# Patient Record
Sex: Female | Born: 1955
Health system: Southern US, Community
[De-identification: ages and names within clinical notes are randomized; demographics above are authoritative.]

## PROBLEM LIST (undated history)

## (undated) DIAGNOSIS — Z5189 Encounter for other specified aftercare: Secondary | ICD-10-CM

## (undated) DIAGNOSIS — Z972 Presence of dental prosthetic device (complete) (partial): Secondary | ICD-10-CM

## (undated) DIAGNOSIS — E785 Hyperlipidemia, unspecified: Secondary | ICD-10-CM

## (undated) DIAGNOSIS — T7840XA Allergy, unspecified, initial encounter: Secondary | ICD-10-CM

## (undated) DIAGNOSIS — F419 Anxiety disorder, unspecified: Secondary | ICD-10-CM

## (undated) DIAGNOSIS — Z973 Presence of spectacles and contact lenses: Secondary | ICD-10-CM

## (undated) DIAGNOSIS — K219 Gastro-esophageal reflux disease without esophagitis: Secondary | ICD-10-CM

## (undated) HISTORY — DX: Anxiety disorder, unspecified: F41.9

## (undated) HISTORY — PX: FRACTURE SURGERY: SHX138

## (undated) HISTORY — DX: Encounter for other specified aftercare: Z51.89

## (undated) HISTORY — PX: MANDIBLE FRACTURE SURGERY: SHX706

## (undated) HISTORY — PX: COSMETIC SURGERY: SHX468

## (undated) HISTORY — DX: Hyperlipidemia, unspecified: E78.5

## (undated) HISTORY — DX: Allergy, unspecified, initial encounter: T78.40XA

---

## 1998-02-18 ENCOUNTER — Other Ambulatory Visit: Admission: RE | Admit: 1998-02-18 | Discharge: 1998-02-18 | Payer: Self-pay | Admitting: Obstetrics & Gynecology

## 1999-05-08 ENCOUNTER — Other Ambulatory Visit: Admission: RE | Admit: 1999-05-08 | Discharge: 1999-05-08 | Payer: Self-pay | Admitting: Obstetrics & Gynecology

## 1999-10-18 ENCOUNTER — Other Ambulatory Visit: Admission: RE | Admit: 1999-10-18 | Discharge: 1999-10-18 | Payer: Self-pay | Admitting: Obstetrics & Gynecology

## 1999-11-06 ENCOUNTER — Other Ambulatory Visit: Admission: RE | Admit: 1999-11-06 | Discharge: 1999-11-06 | Payer: Self-pay | Admitting: Obstetrics & Gynecology

## 2000-01-12 ENCOUNTER — Other Ambulatory Visit: Admission: RE | Admit: 2000-01-12 | Discharge: 2000-01-12 | Payer: Self-pay | Admitting: Obstetrics & Gynecology

## 2000-11-27 ENCOUNTER — Other Ambulatory Visit: Admission: RE | Admit: 2000-11-27 | Discharge: 2000-11-27 | Payer: Self-pay | Admitting: Obstetrics & Gynecology

## 2002-01-07 ENCOUNTER — Other Ambulatory Visit: Admission: RE | Admit: 2002-01-07 | Discharge: 2002-01-07 | Payer: Self-pay | Admitting: Obstetrics & Gynecology

## 2003-02-01 ENCOUNTER — Other Ambulatory Visit: Admission: RE | Admit: 2003-02-01 | Discharge: 2003-02-01 | Payer: Self-pay | Admitting: Obstetrics & Gynecology

## 2004-04-12 ENCOUNTER — Other Ambulatory Visit: Admission: RE | Admit: 2004-04-12 | Discharge: 2004-04-12 | Payer: Self-pay | Admitting: Obstetrics & Gynecology

## 2005-05-21 ENCOUNTER — Other Ambulatory Visit: Admission: RE | Admit: 2005-05-21 | Discharge: 2005-05-21 | Payer: Self-pay | Admitting: Obstetrics & Gynecology

## 2005-05-24 ENCOUNTER — Ambulatory Visit: Payer: Self-pay

## 2006-08-01 ENCOUNTER — Ambulatory Visit: Payer: Self-pay

## 2007-09-25 ENCOUNTER — Ambulatory Visit: Payer: Self-pay | Admitting: Internal Medicine

## 2008-03-25 ENCOUNTER — Ambulatory Visit: Payer: Self-pay | Admitting: Internal Medicine

## 2008-04-01 ENCOUNTER — Ambulatory Visit: Payer: Self-pay

## 2009-04-12 ENCOUNTER — Other Ambulatory Visit: Payer: Self-pay | Admitting: Internal Medicine

## 2009-05-03 ENCOUNTER — Other Ambulatory Visit: Payer: Self-pay | Admitting: Internal Medicine

## 2011-06-07 ENCOUNTER — Ambulatory Visit (INDEPENDENT_AMBULATORY_CARE_PROVIDER_SITE_OTHER): Payer: PRIVATE HEALTH INSURANCE | Admitting: Internal Medicine

## 2011-06-07 ENCOUNTER — Encounter: Payer: Self-pay | Admitting: Internal Medicine

## 2011-06-07 VITALS — BP 108/70 | HR 111 | Temp 97.8°F | Ht 64.0 in | Wt 133.0 lb

## 2011-06-07 DIAGNOSIS — R079 Chest pain, unspecified: Secondary | ICD-10-CM | POA: Insufficient documentation

## 2011-06-07 DIAGNOSIS — K219 Gastro-esophageal reflux disease without esophagitis: Secondary | ICD-10-CM | POA: Insufficient documentation

## 2011-06-07 DIAGNOSIS — J329 Chronic sinusitis, unspecified: Secondary | ICD-10-CM

## 2011-06-07 DIAGNOSIS — R0789 Other chest pain: Secondary | ICD-10-CM

## 2011-06-07 MED ORDER — OMEPRAZOLE 40 MG PO CPDR: 40.0000 mg | DELAYED_RELEASE_CAPSULE | Freq: Every day | ORAL | Status: DC

## 2011-06-07 MED ORDER — AMOXICILLIN-POT CLAVULANATE 875-125 MG PO TABS: 1.0000 | ORAL_TABLET | Freq: Two times a day (BID) | ORAL | Status: AC

## 2011-06-07 NOTE — Assessment & Plan Note (Signed)
Symptoms most consistent with anxiety and GERD.  EKG today was normal.  Will start omeprazole and have her avoid using sudafed. Follow up 1 month or sooner if symptoms are persistent.

## 2011-06-07 NOTE — Progress Notes (Signed)
Subjective:    Patient ID: [name] [name], female    DOB: May 21, 1955, 56 y.o.   MRN: [id number]  HPI 56 year old female with history of anxiety presents for acute visit as a walk-in patient. She reports that about 2 weeks ago she had an upper respiratory infection with nasal congestion and nonproductive cough. Her symptoms seemed to improve until one week ago when she developed increasing sinus pressure, nasal congestion with purulent drainage, nonproductive cough, and some tightness in her chest. She took Sudafed to help with symptoms and had some palpitations after this. She denies any fever or chills. She denies shortness of breath. During this time, she also notes some increased tightness in her chest after eating. She denies any nausea or vomiting. She has not previously taken medications for GERD.  Outpatient Encounter Prescriptions as of 06/07/2011  Medication Sig Dispense Refill  . amoxicillin-clavulanate (AUGMENTIN) 875-125 MG per tablet Take 1 tablet by mouth 2 (two) times daily.  20 tablet  0  . cholecalciferol (VITAMIN D) 1000 UNITS tablet Take 1,000 Units by mouth daily.      . cyanocobalamin 100 MCG tablet Take 100 mcg by mouth daily.      . fexofenadine (ALLEGRA) 180 MG tablet Take 180 mg by mouth daily.      . niacin 250 MG tablet Take 250 mg by mouth daily with breakfast.      . norethindrone-ethinyl estradiol 1/35 (ORTHO-NOVUM, NORTREL,CYCLAFEM) tablet Take 1 tablet by mouth daily.      Marland Kitchen omeprazole (PRILOSEC) 40 MG capsule Take 1 capsule (40 mg total) by mouth daily.  30 capsule  3    Review of Systems  Constitutional: Negative for fever, chills and unexpected weight change.  HENT: Positive for congestion, rhinorrhea, postnasal drip and sinus pressure. Negative for hearing loss, ear pain, nosebleeds, sore throat, facial swelling, sneezing, mouth sores, trouble swallowing, neck pain, neck stiffness, voice change, tinnitus and ear discharge.   Eyes: Negative for pain,  discharge, redness and visual disturbance.  Respiratory: Positive for cough and chest tightness. Negative for shortness of breath, wheezing and stridor.   Cardiovascular: Positive for chest pain and palpitations. Negative for leg swelling.  Musculoskeletal: Negative for myalgias and arthralgias.  Skin: Negative for color change and rash.  Neurological: Negative for dizziness, weakness, light-headedness and headaches.  Hematological: Negative for adenopathy.   BP 108/70  Pulse 111  Temp(Src) 97.8 F (36.6 C) (Oral)  Ht 5\' 4"  (1.626 m)  Wt 133 lb (60.328 kg)  BMI 22.83 kg/m2  SpO2 100%     Objective:   Physical Exam  Constitutional: She is oriented to person, place, and time. She appears well-developed and well-nourished. No distress.  HENT:  Head: Normocephalic and atraumatic.  Right Ear: External ear normal. Tympanic membrane is bulging. Tympanic membrane is not erythematous. A middle ear effusion is present.  Left Ear: External ear normal. Tympanic membrane is erythematous and bulging. A middle ear effusion is present.  Nose: Mucosal edema present.  Mouth/Throat: Oropharynx is clear and moist. No oropharyngeal exudate.  Eyes: Conjunctivae are normal. Pupils are equal, round, and reactive to light. Right eye exhibits no discharge. Left eye exhibits no discharge. No scleral icterus.  Neck: Normal range of motion. Neck supple. No tracheal deviation present. No thyromegaly present.  Cardiovascular: Normal rate, regular rhythm, normal heart sounds and intact distal pulses.  Exam reveals no gallop and no friction rub.   No murmur heard. Pulmonary/Chest: Effort normal and breath sounds normal. No respiratory distress. She  has no wheezes. She has no rales. She exhibits no tenderness.  Musculoskeletal: Normal range of motion. She exhibits no edema and no tenderness.  Lymphadenopathy:    She has no cervical adenopathy.  Neurological: She is alert and oriented to person, place, and time. No  cranial nerve deficit. She exhibits normal muscle tone. Coordination normal.  Skin: Skin is warm and dry. No rash noted. She is not diaphoretic. No erythema. No pallor.  Psychiatric: She has a normal mood and affect. Her behavior is normal. Judgment and thought content normal.          Assessment & Plan:

## 2011-06-07 NOTE — Assessment & Plan Note (Signed)
Chest tightness after eating most consistent with GERD.  Will start Omeprazole to see if any improvement. Follow up in 1 month.

## 2011-06-07 NOTE — Assessment & Plan Note (Signed)
Symptoms consistent with sinusitis with bilateral middle ear effusions.  Will start augmentin x 10 days.  Encouraged pt to avoid sudafed because of palpitations. Continue Allegra and use Tylenol or Ibuprofen prn.  Follow up in 1 month for physical exam and prn if symptoms not improving.

## 2011-06-25 LAB — HM PAP SMEAR: HM Pap smear: NORMAL

## 2011-07-17 ENCOUNTER — Ambulatory Visit (INDEPENDENT_AMBULATORY_CARE_PROVIDER_SITE_OTHER): Payer: PRIVATE HEALTH INSURANCE | Admitting: Internal Medicine

## 2011-07-17 ENCOUNTER — Other Ambulatory Visit (HOSPITAL_COMMUNITY)
Admission: RE | Admit: 2011-07-17 | Discharge: 2011-07-17 | Disposition: A | Discharge status: Home / Self Care | Payer: PRIVATE HEALTH INSURANCE | Source: Ambulatory Visit | Attending: Internal Medicine | Admitting: Internal Medicine

## 2011-07-17 ENCOUNTER — Encounter: Payer: Self-pay | Admitting: Internal Medicine

## 2011-07-17 VITALS — BP 112/68 | HR 82 | Temp 98.3°F | Resp 16 | Ht 64.0 in | Wt 135.0 lb

## 2011-07-17 DIAGNOSIS — Z124 Encounter for screening for malignant neoplasm of cervix: Secondary | ICD-10-CM

## 2011-07-17 DIAGNOSIS — Z1211 Encounter for screening for malignant neoplasm of colon: Secondary | ICD-10-CM

## 2011-07-17 DIAGNOSIS — Z1159 Encounter for screening for other viral diseases: Secondary | ICD-10-CM | POA: Insufficient documentation

## 2011-07-17 DIAGNOSIS — Z0001 Encounter for general adult medical examination with abnormal findings: Secondary | ICD-10-CM | POA: Insufficient documentation

## 2011-07-17 DIAGNOSIS — Z0000 Encounter for general adult medical examination without abnormal findings: Secondary | ICD-10-CM

## 2011-07-17 DIAGNOSIS — Z1239 Encounter for other screening for malignant neoplasm of breast: Secondary | ICD-10-CM

## 2011-07-17 DIAGNOSIS — E785 Hyperlipidemia, unspecified: Secondary | ICD-10-CM

## 2011-07-17 DIAGNOSIS — Z01419 Encounter for gynecological examination (general) (routine) without abnormal findings: Secondary | ICD-10-CM | POA: Insufficient documentation

## 2011-07-17 NOTE — Assessment & Plan Note (Addendum)
Breast and pelvic exams were done today and grossly normal.  PAP sent. Screening mammogram ordered

## 2011-07-17 NOTE — Progress Notes (Signed)
Patient ID: BELLADONNA LUBINSKI, female   DOB: 1955-04-10, 56 y.o.   MRN: [id number]  Subjective:     MARYBELLA ETHIER is a 56 y.o. female here for a routine exam.  Current complaints: none.  Personal health questionnaire reviewed: yes.   Gynecologic History Patient's last menstrual period was 06/25/2011. Contraception: OCP (estrogen/progesterone) Last Pap: 2011. Results were: normal Last mammogram: April 2012 Results were: normal  Obstetric History OB History    Grav Para Term Preterm Abortions TAB SAB Ect Mult Living                   The following portions of the patient's history were reviewed and updated as appropriate: allergies, current medications, past family history, past medical history, past social history, past surgical history and problem list.  Review of Systems Pertinent items are noted in HPI.    Objective:    BP 112/68  Pulse 82  Temp(Src) 98.3 F (36.8 C) (Oral)  Resp 16  Ht 5\' 4"  (1.626 m)  Wt 135 lb (61.236 kg)  BMI 23.17 kg/m2  SpO2 99%  LMP 06/25/2011  General Appearance:    Alert, cooperative, no distress, appears stated age  Head:    Normocephalic, without obvious abnormality, atraumatic  Eyes:    PERRL, conjunctiva/corneas clear, EOM's intact, fundi    benign, both eyes  Ears:    Normal TM's and external ear canals, both ears  Nose:   Nares normal, septum midline, mucosa normal, no drainage    or sinus tenderness  Throat:   Lips, mucosa, and tongue normal; teeth and gums normal  Neck:   Supple, symmetrical, trachea midline, no adenopathy;    thyroid:  no enlargement/tenderness/nodules; no carotid   bruit or JVD  Back:     Symmetric, no curvature, ROM normal, no CVA tenderness  Lungs:     Clear to auscultation bilaterally, respirations unlabored  Chest Wall:    No tenderness or deformity   Heart:    Regular rate and rhythm, S1 and S2 normal, no murmur, rub   or gallop  Breast Exam:    No tenderness, masses, or nipple abnormality  Abdomen:      Soft, non-tender, bowel sounds active all four quadrants,    no masses, no organomegaly  Genitalia:    Normal female without lesion, discharge or tenderness  Rectal:    Normal tone, normal prostate, no masses or tenderness;   guaiac negative stool  Extremities:   Extremities normal, atraumatic, no cyanosis or edema  Pulses:   2+ and symmetric all extremities  Skin:   Skin color, texture, turgor normal, no rashes or lesions  Lymph nodes:   Cervical, supraclavicular, and axillary nodes normal  Neurologic:   CNII-XII intact, normal strength, sensation and reflexes    throughout      Assessment:    Healthy female exam.    Plan:    Education reviewed: calcium supplements, self breast exams and weight bearing exercise. Contraception: OCP (estrogen/progesterone).

## 2011-07-17 NOTE — Assessment & Plan Note (Addendum)
Reviewed her recent lipid panel.  Total 206  LDL 120 HDL 68.  FH of CAD in both parents who were smokers.  rec exercise and Low GI diet.  Stop niacin for now since it is aggravatng hot flashes and palpitations

## 2011-07-17 NOTE — Patient Instructions (Addendum)
Consider swlitching from allegra to zyrtec (cetirizine)  And using Simply saline flushes for your sinuses twice daily   You can use Afrin once daily at bedtime without becoming hooked on it.  ------------------------------------------------------------------------------------------------------------ I am referring you for colonoscopy to Dr. Bluford Kaufmann  Consider the Low Glycemic Index Diet and 6 smaller meals daily .  This boosts your metabolism and regulates your sugars:   7 AM Low carbohydrate Protein  Shakes (EAS Carb Control  Or Atkins ,  Available everywhere,   In  cases at [employer] )  2.5 carbs  (Add or substitute a toasted sandwhich thin w/ peanut butter)  10 AM: Protein bar by Atkins (snack size,  Chocolate lover's variety at  [employer])    Lunch: sandwich on pita bread or flatbread (Joseph's makes a pita bread and a flat bread , available at [employer] and [employer]; Toufayah makes a low carb flatbread available at [employer] and [employer]) Mission makes a low carb whole wheat tortilla available at [employer] most grocery stores   3 PM:  Mid day :  Another protein bar,  Or a  cheese stick, 1/4 cup of almonds, walnuts, pistachios, pecans, peanuts,  Macadamia nuts  6 PM  Dinner:  "mean and green:"  Meat/chicken/fish, salad, and green veggie : use ranch, vinagrette,  Blue cheese, etc  9 PM snack : Breyer's low carb fudgsicle or  ice cream bar (Carb Smart), or  Weight Watcher's ice cream bar , or another protein shake

## 2011-07-18 ENCOUNTER — Other Ambulatory Visit: Payer: Self-pay | Admitting: Internal Medicine

## 2011-07-18 LAB — LIPID PANEL
HDL Cholesterol: 63 mg/dL — ABNORMAL HIGH (ref 40–60)
Ldl Cholesterol, Calc: 81 mg/dL (ref 0–100)
Triglycerides: 98 mg/dL (ref 0–200)
VLDL Cholesterol, Calc: 20 mg/dL (ref 5–40)

## 2011-07-31 ENCOUNTER — Encounter: Payer: Self-pay | Admitting: Internal Medicine

## 2011-09-18 ENCOUNTER — Ambulatory Visit: Payer: Self-pay | Admitting: Internal Medicine

## 2011-09-19 ENCOUNTER — Telehealth: Payer: Self-pay | Admitting: Internal Medicine

## 2011-09-19 NOTE — Telephone Encounter (Signed)
Left message notifying patient.

## 2011-09-19 NOTE — Telephone Encounter (Signed)
Her mammogram was normal.  Repeat in one year 

## 2011-10-08 ENCOUNTER — Encounter: Payer: Self-pay | Admitting: Internal Medicine

## 2011-10-11 ENCOUNTER — Telehealth: Payer: Self-pay | Admitting: Internal Medicine

## 2011-10-11 NOTE — Telephone Encounter (Signed)
Pt called stated she received letter telling her it was time for cpx  Pt stated she had this done 06/30/11  Please advise She also stated she had her mammogram this year @ [facility]   Pt stated she had her colonscopy set up   11/12/11 with DR O Pt also needs a new my chart activation number she wants to set this up

## 2011-10-12 NOTE — Telephone Encounter (Signed)
patient notified to disregard the letter, she did have her physical in April.

## 2011-11-12 ENCOUNTER — Ambulatory Visit: Payer: Self-pay | Admitting: Gastroenterology

## 2011-11-12 LAB — HM COLONOSCOPY: HM Colonoscopy: NORMAL

## 2011-11-14 LAB — PATHOLOGY REPORT

## 2011-11-26 ENCOUNTER — Encounter: Payer: Self-pay | Admitting: Internal Medicine

## 2011-12-07 ENCOUNTER — Encounter: Payer: Self-pay | Admitting: Internal Medicine

## 2011-12-09 ENCOUNTER — Encounter: Payer: Self-pay | Admitting: Internal Medicine

## 2011-12-17 ENCOUNTER — Encounter: Payer: Self-pay | Admitting: Internal Medicine

## 2011-12-21 ENCOUNTER — Other Ambulatory Visit: Payer: Self-pay | Admitting: *Deleted

## 2011-12-21 ENCOUNTER — Telehealth: Payer: Self-pay | Admitting: Internal Medicine

## 2011-12-21 MED ORDER — NORETHIN-ETH ESTRAD TRIPHASIC 0.5/0.75/1-35 MG-MCG PO TABS: 1.0000 | ORAL_TABLET | Freq: Every day | ORAL | Status: DC

## 2011-12-21 MED ORDER — NORETHINDRONE-ETH ESTRADIOL 1-35 MG-MCG PO TABS: 1.0000 | ORAL_TABLET | Freq: Every day | ORAL | Status: DC

## 2011-12-21 NOTE — Telephone Encounter (Signed)
Refill request for necon 7-09-23-26 84 tab Take one tablet by mouth daily

## 2011-12-21 NOTE — Telephone Encounter (Signed)
Pt is going out of town and is needing the rx as soon as possible. She does apologize for the last minute request. She would like to be called when it is ready,  Best number [phone]

## 2011-12-21 NOTE — Telephone Encounter (Signed)
Patient needs her birth control pills phoned into [facility] she needs this called in today. She did call and ask them to send request for it.

## 2011-12-21 NOTE — Telephone Encounter (Signed)
Rx sent, pt informed via VM

## 2011-12-24 ENCOUNTER — Encounter: Payer: Self-pay | Admitting: Internal Medicine

## 2012-03-14 ENCOUNTER — Other Ambulatory Visit: Payer: Self-pay | Admitting: Internal Medicine

## 2012-03-14 MED ORDER — NORETHIN-ETH ESTRAD TRIPHASIC 0.5/0.75/1-35 MG-MCG PO TABS: 1.0000 | ORAL_TABLET | Freq: Every day | ORAL | Status: DC

## 2012-03-14 NOTE — Telephone Encounter (Signed)
Pt is out of her birth control and is needing a refill on Necon 7-09-23-26 tablet She uses [facility].

## 2012-03-14 NOTE — Telephone Encounter (Signed)
Med filled.  

## 2012-04-10 ENCOUNTER — Other Ambulatory Visit: Payer: Self-pay | Admitting: Internal Medicine

## 2012-04-10 NOTE — Telephone Encounter (Signed)
Scheduled

## 2012-04-10 NOTE — Telephone Encounter (Signed)
She will need to make an appt. To discuss the whether she needs to continue hormone therapy bc it is not a simple conversation

## 2012-04-10 NOTE — Telephone Encounter (Signed)
Pt calling with questions about her birth control Rx.  Pt states she was told by her pharmacy that because she is now 56yo her insurance will not cover her birth control anymore and that it needs to be a hormone pill. Pt needed the refill this week. Please advise pt.  Can call [phone] or [phone]

## 2012-04-14 ENCOUNTER — Encounter: Payer: Self-pay | Admitting: Internal Medicine

## 2012-04-14 ENCOUNTER — Ambulatory Visit (INDEPENDENT_AMBULATORY_CARE_PROVIDER_SITE_OTHER): Payer: 59 | Admitting: Internal Medicine

## 2012-04-14 VITALS — BP 110/70 | HR 81 | Temp 97.7°F | Resp 16 | Wt 139.8 lb

## 2012-04-14 DIAGNOSIS — J3089 Other allergic rhinitis: Secondary | ICD-10-CM

## 2012-04-14 DIAGNOSIS — Z5320 Procedure and treatment not carried out because of patient's decision for unspecified reasons: Secondary | ICD-10-CM

## 2012-04-14 MED ORDER — NORETHIN-ETH ESTRAD TRIPHASIC 0.5/0.75/1-35 MG-MCG PO TABS: 1.0000 | ORAL_TABLET | Freq: Every day | ORAL | Status: DC

## 2012-04-14 MED ORDER — FEXOFENADINE-PSEUDOEPHED ER 180-240 MG PO TB24: 1.0000 | ORAL_TABLET | Freq: Every day | ORAL | Status: DC

## 2012-04-14 NOTE — Progress Notes (Signed)
Patient ID: [name] [name], female   DOB: 08-20-1955, 57 y.o.   MRN: [id number]  Patient Active Problem List  Diagnosis  . Sinusitis  . GERD (gastroesophageal reflux disease)  . Chest pain  . Other and unspecified hyperlipidemia  . Annual physical exam  . Allergic rhinitis due to dust  . Menopause present, declines hormone replacement therapy    Subjective:  CC:   Chief Complaint  Patient presents with  . medication management    Birth Control    HPI:   [name] Rosier Smithis a 57 y.o. female who presents to discuss hormone therapy. She has been been taking birth control for contraception  For over 20 years and was told recently by her pharmacist that her insurance would no longer pay because of her age.  She has menstrual periods monthly and has no idea what age her mother went through menopause.   2) chronic dizziness,.  No change in symptoms with zyrtec , currently on allegra.  ENT started flonase recently but she developed nosebleeds with it     Past Medical History  Diagnosis Date  . Hyperlipidemia   . Anxiety   . Allergy     History reviewed. No pertinent past surgical history.       The following portions of the patient's history were reviewed and updated as appropriate: Allergies, current medications, and problem list.    Review of Systems:   Patient denies headache, fevers, malaise, unintentional weight loss, skin rash, eye pain,  sore throat, dysphagia,  hemoptysis , cough, dyspnea, wheezing, chest pain, palpitations, orthopnea, edema, abdominal pain, nausea, melena, diarrhea, constipation, flank pain, dysuria, hematuria, urinary  Frequency, nocturia, numbness, tingling, seizures,  Focal weakness, Loss of consciousness,  Tremor, insomnia, depression, anxiety, and suicidal ideation.         History   Social History  . Marital Status: Married    Spouse Name: N/A    Number of Children: N/A  . Years of Education: N/A   Occupational History  . Not on  file.   Social History Main Topics  . Smoking status: Never Smoker   . Smokeless tobacco: Never Used  . Alcohol Use: No  . Drug Use: No  . Sexually Active: Not on file   Other Topics Concern  . Not on file   Social History Narrative   Lives in [place]. Works at [employer].    Objective:  BP 110/70  Pulse 81  Temp 97.7 F (36.5 C) (Oral)  Resp 16  Wt 139 lb 12 oz (63.39 kg)  SpO2 98%  LMP 04/07/2012  General appearance: alert, cooperative and appears stated age Ears: normal TM's and external ear canals both ears Throat: lips, mucosa, and tongue normal; teeth and gums normal Neck: no adenopathy, no carotid bruit, supple, symmetrical, trachea midline and thyroid not enlarged, symmetric, no tenderness/mass/nodules Back: symmetric, no curvature. ROM normal. No CVA tenderness. Lungs: clear to auscultation bilaterally Heart: regular rate and rhythm, S1, S2 normal, no murmur, click, rub or gallop Abdomen: soft, non-tender; bowel sounds normal; no masses,  no organomegaly Pulses: 2+ and symmetric Skin: Skin color, texture, turgor normal. No rashes or lesions Lymph nodes: Cervical, supraclavicular, and axillary nodes normal.  Assessment and Plan:  Allergic rhinitis due to dust rec trial of allegra d and saline flushes daily  Menopause present, declines hormone replacement therapy Patient has been using oral contraception for many years and continues to have menstruation at age 7 .  The risk and benefits of continuing  hormonal manipulation were discussed at length.  She has no desire to continue hormonal therapy for menopause, only to prevent pregnancy. Suggested she stop her medication for 6 weeks and allow [place] to confirm menopause so she can stop OCPs   Updated Medication List Outpatient Encounter Prescriptions as of 04/14/2012  Medication Sig Dispense Refill  . cholecalciferol (VITAMIN D) 1000 UNITS tablet Take 1,000 Units by mouth daily.      . cyanocobalamin 100 MCG tablet Take  1,000 mcg by mouth daily.       . norethindrone-ethinyl estradiol (ORTHO-NOVUM 7/7/7, 28,) 0.5/0.75/1-35 MG-MCG tablet Take 1 tablet by mouth daily.  2 Package  3  . [DISCONTINUED] fexofenadine (ALLEGRA) 180 MG tablet Take 180 mg by mouth daily.      . [DISCONTINUED] norethindrone-ethinyl estradiol (ORTHO-NOVUM 7/7/7, 28,) 0.5/0.75/1-35 MG-MCG tablet Take 1 tablet by mouth daily.  1 Package  3  . [DISCONTINUED] pseudoephedrine (SUDAFED) 30 MG tablet Take 60 mg by mouth every 4 (four) hours as needed.      . fexofenadine-pseudoephedrine (ALLEGRA-D 24 HOUR) 180-240 MG per 24 hr tablet Take 1 tablet by mouth daily.  30 tablet  11  . norethindrone-ethinyl estradiol (TRIPHASIL,CYCLAFEM,ALYACEN) 0.5/0.75/1-35 MG-MCG tablet Take 1 tablet by mouth daily.  1 Package  11  . [DISCONTINUED] niacin 250 MG tablet Take 500 mg by mouth daily with breakfast.          No orders of the defined types were placed in this encounter.    No Follow-up on file.

## 2012-04-14 NOTE — Patient Instructions (Addendum)
If you want to be tested for menopause, return after a minimum of 6 weeks without contraceptives  Try allegra D daily for the congestion  Try rinsing sinuses twice daily with Dr. Nigel Mormon sinus rinse or Simply Saline

## 2012-04-14 NOTE — Assessment & Plan Note (Addendum)
rec trial of allegra d and saline flushes daily

## 2012-04-15 ENCOUNTER — Encounter: Payer: Self-pay | Admitting: Internal Medicine

## 2012-04-15 DIAGNOSIS — Z5320 Procedure and treatment not carried out because of patient's decision for unspecified reasons: Secondary | ICD-10-CM | POA: Insufficient documentation

## 2012-04-15 NOTE — Assessment & Plan Note (Signed)
Patient has been using oral contraception for many years and continues to have menstruation at age 57 .  Suggested she stop her medication for 6 weeks and allow [place] to confirm menopause so she can stop OCPs

## 2012-12-18 ENCOUNTER — Telehealth: Payer: Self-pay | Admitting: Internal Medicine

## 2012-12-18 DIAGNOSIS — R5381 Other malaise: Secondary | ICD-10-CM

## 2012-12-18 DIAGNOSIS — Z1159 Encounter for screening for other viral diseases: Secondary | ICD-10-CM

## 2012-12-18 DIAGNOSIS — E785 Hyperlipidemia, unspecified: Secondary | ICD-10-CM

## 2012-12-18 DIAGNOSIS — E559 Vitamin D deficiency, unspecified: Secondary | ICD-10-CM

## 2012-12-18 NOTE — Telephone Encounter (Signed)
Pt asking if she can get labs done a few days before physical 10/8 so they can discuss results at appointment.  No orders in.  No lab appt scheduled at this time.  May call work or cell to advise pt.

## 2012-12-18 NOTE — Telephone Encounter (Signed)
Pt notified, lab appt scheduled  

## 2012-12-18 NOTE — Telephone Encounter (Signed)
What labs would you like? and I will call her.

## 2012-12-18 NOTE — Telephone Encounter (Signed)
Yes, I have ordered fasting labs

## 2012-12-19 ENCOUNTER — Other Ambulatory Visit (INDEPENDENT_AMBULATORY_CARE_PROVIDER_SITE_OTHER): Payer: 59

## 2012-12-19 DIAGNOSIS — R5381 Other malaise: Secondary | ICD-10-CM

## 2012-12-19 DIAGNOSIS — Z1159 Encounter for screening for other viral diseases: Secondary | ICD-10-CM

## 2012-12-19 DIAGNOSIS — E559 Vitamin D deficiency, unspecified: Secondary | ICD-10-CM

## 2012-12-19 DIAGNOSIS — E785 Hyperlipidemia, unspecified: Secondary | ICD-10-CM

## 2012-12-19 LAB — CBC WITH DIFFERENTIAL/PLATELET
Basophils Relative: 0.5 % (ref 0.0–3.0)
Eosinophils Relative: 1.2 % (ref 0.0–5.0)
Hemoglobin: 12.5 g/dL (ref 12.0–15.0)
Lymphocytes Relative: 27.2 % (ref 12.0–46.0)
Monocytes Relative: 10 % (ref 3.0–12.0)
Neutro Abs: 3 10*3/uL (ref 1.4–7.7)
RBC: 4.51 Mil/uL (ref 3.87–5.11)
WBC: 5 10*3/uL (ref 4.5–10.5)

## 2012-12-19 LAB — COMPREHENSIVE METABOLIC PANEL
BUN: 17 mg/dL (ref 6–23)
CO2: 28 mEq/L (ref 19–32)
Calcium: 9.3 mg/dL (ref 8.4–10.5)
Chloride: 105 mEq/L (ref 96–112)
Creatinine, Ser: 0.7 mg/dL (ref 0.4–1.2)
GFR: 100.29 mL/min (ref >60.00)
Sodium: 139 mEq/L (ref 135–145)

## 2012-12-19 LAB — LIPID PANEL
Cholesterol: 179 mg/dL (ref 0–200)
Total CHOL/HDL Ratio: 3
Triglycerides: 76 mg/dL (ref 0.0–149.0)

## 2012-12-20 LAB — HEPATITIS C ANTIBODY: HCV Ab: NEGATIVE

## 2012-12-21 ENCOUNTER — Encounter: Payer: Self-pay | Admitting: Internal Medicine

## 2012-12-23 NOTE — Telephone Encounter (Signed)
Mailed unread message to pt  

## 2012-12-24 ENCOUNTER — Encounter: Payer: Self-pay | Admitting: Internal Medicine

## 2012-12-24 ENCOUNTER — Ambulatory Visit (INDEPENDENT_AMBULATORY_CARE_PROVIDER_SITE_OTHER): Payer: 59 | Admitting: Internal Medicine

## 2012-12-24 VITALS — BP 112/82 | HR 99 | Temp 98.0°F | Resp 14 | Ht 64.0 in | Wt 134.5 lb

## 2012-12-24 DIAGNOSIS — N912 Amenorrhea, unspecified: Secondary | ICD-10-CM

## 2012-12-24 DIAGNOSIS — Z5320 Procedure and treatment not carried out because of patient's decision for unspecified reasons: Secondary | ICD-10-CM

## 2012-12-24 DIAGNOSIS — Z1239 Encounter for other screening for malignant neoplasm of breast: Secondary | ICD-10-CM

## 2012-12-24 DIAGNOSIS — J3089 Other allergic rhinitis: Secondary | ICD-10-CM

## 2012-12-24 DIAGNOSIS — E785 Hyperlipidemia, unspecified: Secondary | ICD-10-CM

## 2012-12-24 DIAGNOSIS — N911 Secondary amenorrhea: Secondary | ICD-10-CM

## 2012-12-24 DIAGNOSIS — Z0000 Encounter for general adult medical examination without abnormal findings: Secondary | ICD-10-CM

## 2012-12-24 MED ORDER — MONTELUKAST SODIUM 10 MG PO TABS: 10.0000 mg | ORAL_TABLET | Freq: Every day | ORAL | Status: DC

## 2012-12-24 NOTE — Assessment & Plan Note (Addendum)
She discontinued  birth control as of  August 30

## 2012-12-24 NOTE — Patient Instructions (Addendum)
I recommend starting Singulair for your allergies,  It is NOT an antihistamine so it will not have the side effects of dryness that the Allegra does  Stop the allegra after a few days of overlapping them.  yoy may not need both.  Time will tell  Use the sudafed as needed for sinus pressure  Your cholesterol does not need treatment unless your LDL is > 160  Return in 6 months for hormone levels if you desire (not absolute)    I will order your mammogram   Ask Employee [facility] about your TDaP vaccine (if you had it) and whether they will be offering the Prevnar pneumonia vaccine

## 2012-12-24 NOTE — Progress Notes (Signed)
Patient ID: [name] [name], female   DOB: 1955-07-25, 57 y.o.   MRN: [id number]   .[email or web address] Subjective:     [name] [name] is a 57 y.o. female and is here for a comprehensive physical exam. The patient reports .Marland Kitchen  History   Social History  . Marital Status: Married    Spouse Name: N/A    Number of Children: N/A  . Years of Education: N/A   Occupational History  . Not on file.   Social History Main Topics  . Smoking status: Never Smoker   . Smokeless tobacco: Never Used  . Alcohol Use: No  . Drug Use: No  . Sexual Activity: Not on file   Other Topics Concern  . Not on file   Social History Narrative   Lives in [place]. Works at [employer].   Health Maintenance  Topic Date Due  . Tetanus/tdap  03/03/1976  . Mammogram  09/17/2013  . Influenza Vaccine  10/17/2013  . Pap Smear  07/17/2014  . Colonoscopy  11/11/2021    The following portions of the patient's history were reviewed and updated as appropriate: allergies, current medications, past family history, past medical history, past social history, past surgical history and problem list.  Review of Systems A comprehensive review of systems was negative.   Objective:   BP 112/82  Pulse 99  Temp(Src) 98 F (36.7 C) (Oral)  Resp 14  Ht 5\' 4"  (1.626 m)  Wt 134 lb 8 oz (61.009 kg)  BMI 23.08 kg/m2  SpO2 99%  LMP 11/18/2012  General Appearance:    Alert, cooperative, no distress, appears stated age  Head:    Normocephalic, without obvious abnormality, atraumatic  Eyes:    PERRL, conjunctiva/corneas clear, EOM's intact, fundi    benign, both eyes  Ears:    Normal TM's and external ear canals, both ears  Nose:   Nares normal, septum midline, mucosa normal, no drainage    or sinus tenderness  Throat:   Lips, mucosa, and tongue normal; teeth and gums normal  Neck:   Supple, symmetrical, trachea midline, no adenopathy;    thyroid:  no enlargement/tenderness/nodules; no carotid   bruit or JVD  Back:     Symmetric, no  curvature, ROM normal, no CVA tenderness  Lungs:     Clear to auscultation bilaterally, respirations unlabored  Chest Wall:    No tenderness or deformity   Heart:    Regular rate and rhythm, S1 and S2 normal, no murmur, rub   or gallop  Breast Exam:    No tenderness, masses, or nipple abnormality  Abdomen:     Soft, non-tender, bowel sounds active all four quadrants,    no masses, no organomegaly  Extremities:   Extremities normal, atraumatic, no cyanosis or edema  Pulses:   2+ and symmetric all extremities  Skin:   Skin color, texture, turgor normal, no rashes or lesions  Lymph nodes:   Cervical, supraclavicular, and axillary nodes normal  Neurologic:   CNII-XII intact, normal strength, sensation and reflexes    throughout    Assessment:   Menopause present, declines hormone replacement therapy She discontinued  birth control as of  August 30.  She is concerned about when she will enter menopause.  Advised to return in 6 months for testing if desired.   Allergic rhinitis due to dust continue allegra  Will add singulair if symptoms are not controlled.   Annual physical exam Annual comprehensive exam was done including breast without pelvic exam .  All screenings have been addressed .   Other and unspecified hyperlipidemia Lab Results  Component Value Date   CHOL 179 12/19/2012   HDL 68.60 12/19/2012   LDLCALC 95 12/19/2012   TRIG 76.0 12/19/2012   CHOLHDL 3 12/19/2012   Her 10 year risk of CAD is 2.89% based on Framingham Risk Index.  Reassurance provided that she does not need statin therapy at this time.    Updated Medication List Outpatient Encounter Prescriptions as of 12/24/2012  Medication Sig Dispense Refill  . cholecalciferol (VITAMIN D) 1000 UNITS tablet Take 1,000 Units by mouth daily.      . cyanocobalamin 100 MCG tablet Take 1,000 mcg by mouth daily.       . fexofenadine-pseudoephedrine (ALLEGRA-D 24 HOUR) 180-240 MG per 24 hr tablet Take 1 tablet by mouth daily.  30  tablet  11  . montelukast (SINGULAIR) 10 MG tablet Take 1 tablet (10 mg total) by mouth at bedtime.  30 tablet  3  . norethindrone-ethinyl estradiol (ORTHO-NOVUM 7/7/7, 28,) 0.5/0.75/1-35 MG-MCG tablet Take 1 tablet by mouth daily.  2 Package  3  . norethindrone-ethinyl estradiol (TRIPHASIL,CYCLAFEM,ALYACEN) 0.5/0.75/1-35 MG-MCG tablet Take 1 tablet by mouth daily.  1 Package  11   No facility-administered encounter medications on file as of 12/24/2012.

## 2012-12-25 NOTE — Assessment & Plan Note (Signed)
contineu allelgra  Will add singulair if symptoms are not controlled.

## 2012-12-25 NOTE — Assessment & Plan Note (Addendum)
Lab Results  Component Value Date   CHOL 179 12/19/2012   HDL 68.60 12/19/2012   LDLCALC 95 12/19/2012   TRIG 76.0 12/19/2012   CHOLHDL 3 12/19/2012   Her 10 year risk of CAD is 2.89% based on Framingham Risk Index.  Reassurance provided that she does not need statin therapy at this time.

## 2012-12-25 NOTE — Assessment & Plan Note (Signed)
Annual comprehensive exam was done including breast without pelvic exam . All screenings have been addressed .

## 2013-01-01 ENCOUNTER — Telehealth: Payer: Self-pay | Admitting: Internal Medicine

## 2013-01-01 ENCOUNTER — Ambulatory Visit (INDEPENDENT_AMBULATORY_CARE_PROVIDER_SITE_OTHER): Payer: 59 | Admitting: Family Medicine

## 2013-01-01 ENCOUNTER — Encounter: Payer: Self-pay | Admitting: Family Medicine

## 2013-01-01 VITALS — BP 112/78 | HR 76 | Temp 98.2°F | Wt 133.0 lb

## 2013-01-01 DIAGNOSIS — I10 Essential (primary) hypertension: Secondary | ICD-10-CM

## 2013-01-01 DIAGNOSIS — T50995A Adverse effect of other drugs, medicaments and biological substances, initial encounter: Secondary | ICD-10-CM

## 2013-01-01 DIAGNOSIS — J3089 Other allergic rhinitis: Secondary | ICD-10-CM

## 2013-01-01 DIAGNOSIS — T7840XA Allergy, unspecified, initial encounter: Secondary | ICD-10-CM | POA: Insufficient documentation

## 2013-01-01 NOTE — Progress Notes (Signed)
  Subjective:    Patient ID: [name] [name], female    DOB: Sep 14, 1955, 57 y.o.   MRN: [id number]  HPI CC: ?med reaction  Started on singulair 12/25/2012 at night for significant allergic rhinitis.  Had bad leg cramp on Friday night.  Woke up again on Sunday morning with headache.  This week started itching and had rash develop. No worsening dyspnea, chest pain, coughing,   Has also been taking allegra. Nasal steroids caused epistaxis in the past.  Allergic rhinitis - to dust. sxs present with pressure headaches, congestion, and dry throat.  + PNdrainage.  Mild rhinorrhea and no sneezing.   Past Medical History  Diagnosis Date  . Hyperlipidemia   . Anxiety   . Allergy     Review of Systems Per HPi    Objective:   Physical Exam  Vitals reviewed. Constitutional: She appears well-developed and well-nourished. No distress.  HENT:  Head: Normocephalic and atraumatic.  Right Ear: Hearing, tympanic membrane, external ear and ear canal normal.  Left Ear: Hearing, tympanic membrane, external ear and ear canal normal.  Nose: Septal deviation (to right) present.  Mouth/Throat: Uvula is midline, oropharynx is clear and moist and mucous membranes are normal. No oropharyngeal exudate, posterior oropharyngeal edema, posterior oropharyngeal erythema or tonsillar abscesses.  Pale nasal mucosa  Eyes: Conjunctivae and EOM are normal. Pupils are equal, round, and reactive to light. No scleral icterus.  Neck: Normal range of motion. Neck supple.  Cardiovascular: Normal rate, regular rhythm, normal heart sounds and intact distal pulses.   No murmur heard. Pulmonary/Chest: Effort normal and breath sounds normal. No respiratory distress. She has no wheezes. She has no rales.  Lymphadenopathy:    She has no cervical adenopathy.  Skin: Skin is warm and dry. Rash noted.  2 separate rashes: 1. erythematous faint pruritic papules on upper lateral and anterior arms, excoriated.  Area of bruising noted  right upper arm. 2. Small area of nonblanching petechial rash bilateral axilla       Assessment & Plan:

## 2013-01-01 NOTE — Telephone Encounter (Signed)
FYI

## 2013-01-01 NOTE — Assessment & Plan Note (Signed)
Singulair was likely responsible for recent symptoms including myalgia, headache, and skin rash. ?churg strauss type reaction to LTRA - check CBC with diff for peripheral eosinophilia - mainly for information in case reaction does not continue to improve - if eosinophilia, would consider steroid course. In interim, use benadryl prn and anticipate daily improvement.  Pt agrees with plan.

## 2013-01-01 NOTE — Telephone Encounter (Signed)
Patient Information:  Caller Name: Laury  Phone: [phone]  Patient: [name] [name], [name] [name]  Gender: Female  DOB: 11/23/1955  Age: 57 Years  PCP: Duncan Dull (Adults only)  Pregnant: No  Office Follow Up:  Does the office need to follow up with this patient?: No  Instructions For The Office: N/A  RN Note:  Off BCP since 11/15/12. Noted blood colored spots under both arms 12/31/12. No appointments remain for 01/01/13 at [facility].  Scheduled for 1300 01/01/13 with Dr Sharen Hones at [facility].    Symptoms  Reason For Call & Symptoms: Called to request MD change Singulair to another Rx for sinuses.  Reports symptoms of an allergic reaction;  initially had leg cramps and headache that resolved followed by itching arms with "welts" and bruises from rubbing since 12/29/12.  Stopped Singulair 12/30/12 with improvement of symptoms.  Reviewed Health History In EMR: Yes  Reviewed Medications In EMR: Yes  Reviewed Allergies In EMR: Yes  Reviewed Surgeries / Procedures: Yes  Date of Onset of Symptoms: 12/27/2012  Treatments Tried: Stopped Singulair 12/30/12  Treatments Tried Worked: Yes OB / GYN:  LMP: 11/17/2012  Guideline(s) Used:  Hives  Rash - Widespread on Drugs - Drug Reaction  Disposition Per Guideline:   Go to Office Now  Reason For Disposition Reached:   Purple or blood-colored spots or dots (no fever)  Advice Given:  N/A  Patient Will Follow Care Advice:  YES

## 2013-01-01 NOTE — Patient Instructions (Addendum)
I do think this is singulair reaction - I'm glad you've stopped this. For itch - use benadryl 25mg  as needed (may make you sleepy). Let us know if not improving with this. Rash should slowly continue to improve. Blood test today.

## 2013-01-01 NOTE — Assessment & Plan Note (Signed)
Discussed return to antihistamine + decongestant for allergic rhinitis.

## 2013-01-02 LAB — CBC WITH DIFFERENTIAL/PLATELET
Basophils Absolute: 0.1 10*3/uL (ref 0.0–0.1)
Basophils Relative: 1.9 % (ref 0.0–3.0)
Eosinophils Relative: 0.6 % (ref 0.0–5.0)
Monocytes Absolute: 0.6 10*3/uL (ref 0.1–1.0)
Monocytes Relative: 7.5 % (ref 3.0–12.0)
Neutrophils Relative %: 63.1 % (ref 43.0–77.0)
Platelets: 243 10*3/uL (ref 150.0–400.0)
RBC: 4.17 Mil/uL (ref 3.87–5.11)
WBC: 7.4 10*3/uL (ref 4.5–10.5)

## 2013-01-02 NOTE — Progress Notes (Signed)
Pt rescheduled for later in theday.

## 2013-01-06 ENCOUNTER — Ambulatory Visit: Payer: Self-pay | Admitting: Internal Medicine

## 2013-01-28 ENCOUNTER — Encounter: Payer: Self-pay | Admitting: Internal Medicine

## 2013-05-02 IMAGING — MG MM CAD SCREENING MAMMO
1 series · 4 of 4 positions shown · non-contrast
Comparison: none

REASON FOR EXAM: SCR MAMMO CAT 2 NO ORDER
COMMENTS:

[R CC · right · 4 of 4 slices shown]
[im 1/4]
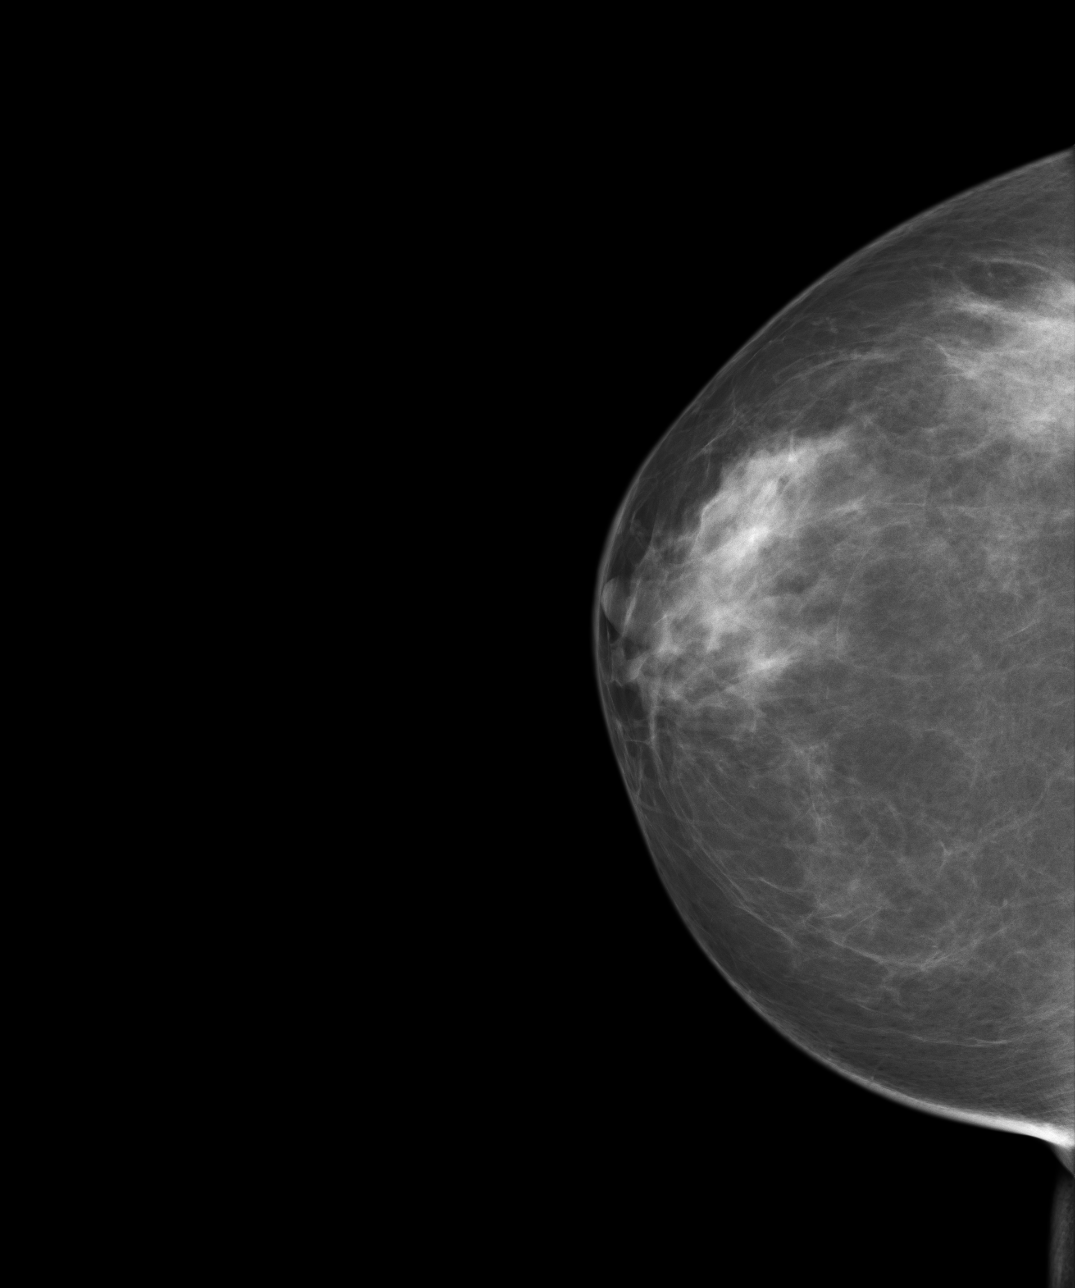
[im 2/4]
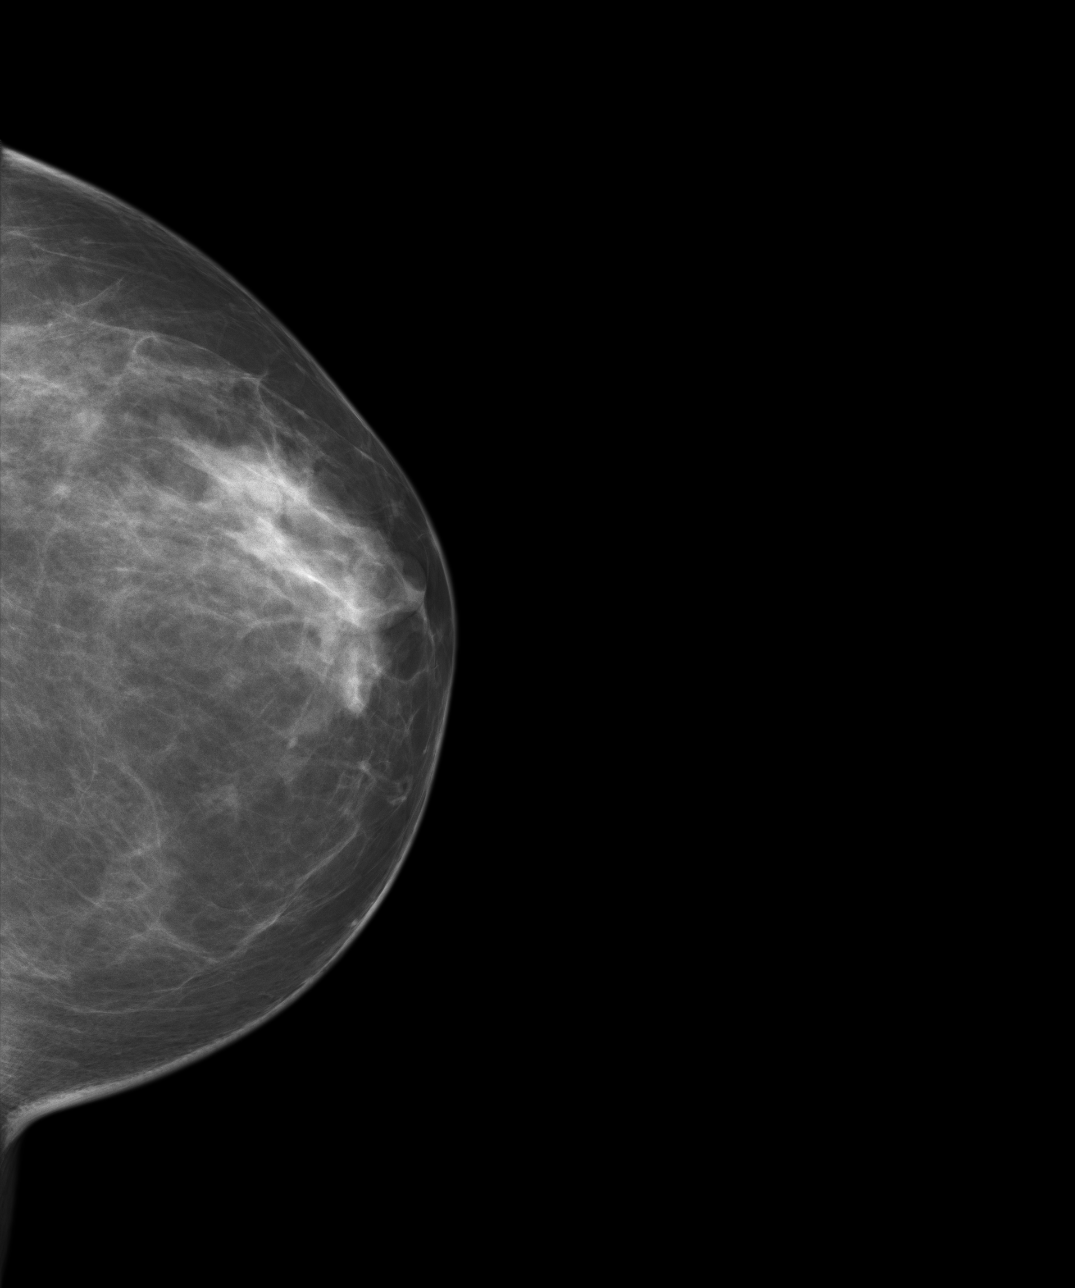
[im 3/4]
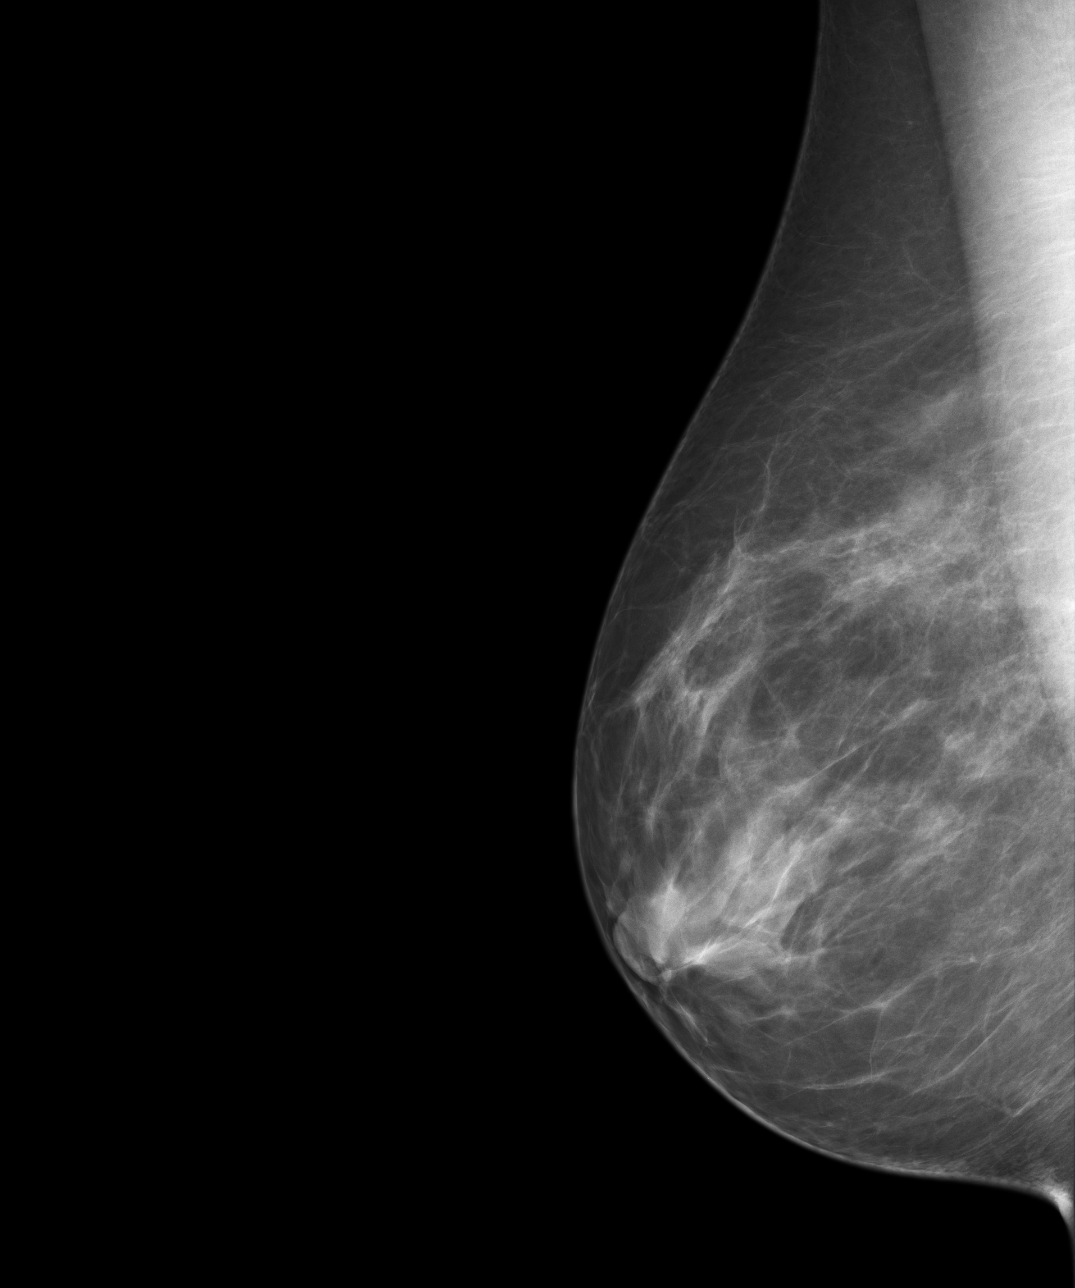
[im 4/4]
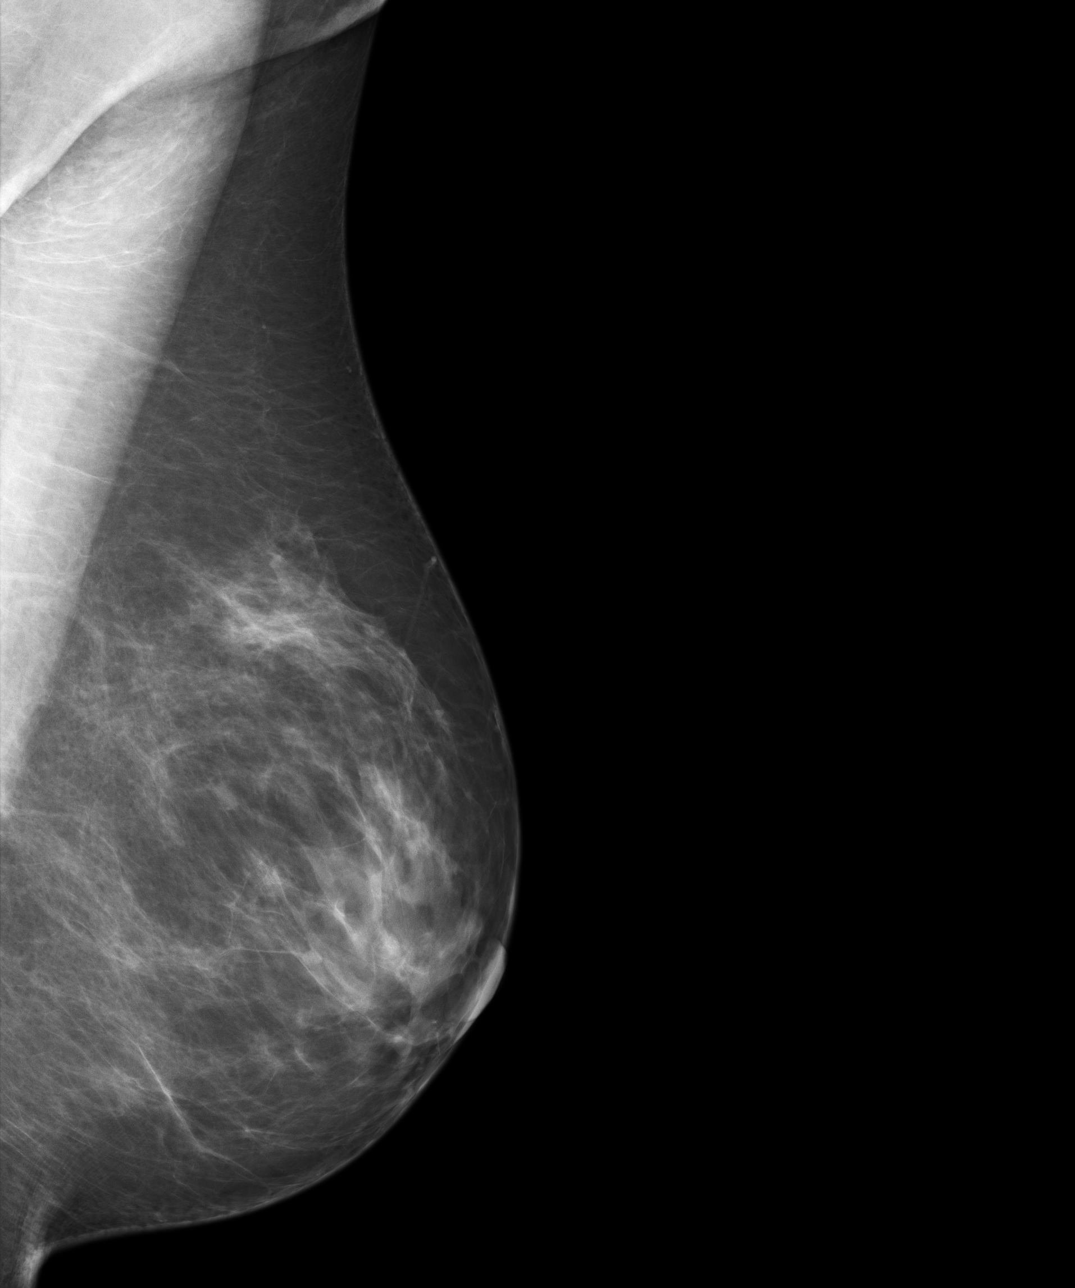

[4 of 4 positions shown; findings below may reference images not displayed]

PROCEDURE:     MAM - MAM DGTL SCRN MAM NO ORDER W/CAD  - September 18, 2011  [DATE]

RESULT:     There is a family history of breast cancer in the patient's
maternal great aunt. Comparison is made to previous digital mammographic
studies from 01 April, 2008, as well as 01 August, 2006 and 10 December, 2001.
The breasts exhibit a mild to moderately dense parenchymal pattern with no
developing parenchymal density, dominant mass or malignant calcification.
There is no architectural distortion. The appearance is stable.
IMPRESSION: 1.Stable, benign appearing bilateral mammogram.

BI-RADS: Category 2 - Benign Findings
Please continue to encourage annual mammographic follow-up.

A NEGATIVE MAMMOGRAM REPORT DOES NOT PRECLUDE BIOPSY OR OTHER EVALUATION OF
A CLINICALLY PALPABLE OR OTHERWISE SUSPICIOUS MASS OR LESION. BREAST CANCER
MAY NOT BE DETECTED BY MAMMOGRAPHY IN UP TO 10% OF CASES.

Dictation Site:1

## 2013-05-19 ENCOUNTER — Telehealth: Payer: Self-pay | Admitting: Internal Medicine

## 2013-05-19 DIAGNOSIS — E785 Hyperlipidemia, unspecified: Secondary | ICD-10-CM

## 2013-05-19 DIAGNOSIS — R5381 Other malaise: Secondary | ICD-10-CM

## 2013-05-19 DIAGNOSIS — N912 Amenorrhea, unspecified: Secondary | ICD-10-CM

## 2013-05-19 DIAGNOSIS — E559 Vitamin D deficiency, unspecified: Secondary | ICD-10-CM

## 2013-05-19 DIAGNOSIS — R5383 Other fatigue: Principal | ICD-10-CM

## 2013-05-19 NOTE — Telephone Encounter (Signed)
Pt came into office to schedule f/u appt.  Appt scheduled 3/13 at 8:15 a.m.  Pt asking if she should come fasting and have blood work done at this time.  Pt states she does want to check her fertility as she has stopped the pill.

## 2013-05-20 NOTE — Telephone Encounter (Signed)
Las ordered , make appt in advance for labs

## 2013-05-20 NOTE — Telephone Encounter (Signed)
Please advise 

## 2013-05-22 NOTE — Telephone Encounter (Signed)
Lab appt scheduled.  Pt aware.

## 2013-05-26 ENCOUNTER — Other Ambulatory Visit (INDEPENDENT_AMBULATORY_CARE_PROVIDER_SITE_OTHER): Payer: 59

## 2013-05-26 ENCOUNTER — Encounter: Payer: Self-pay | Admitting: Internal Medicine

## 2013-05-26 DIAGNOSIS — E785 Hyperlipidemia, unspecified: Secondary | ICD-10-CM

## 2013-05-26 DIAGNOSIS — E559 Vitamin D deficiency, unspecified: Secondary | ICD-10-CM

## 2013-05-26 DIAGNOSIS — R5381 Other malaise: Secondary | ICD-10-CM

## 2013-05-26 DIAGNOSIS — N912 Amenorrhea, unspecified: Secondary | ICD-10-CM

## 2013-05-26 DIAGNOSIS — N911 Secondary amenorrhea: Secondary | ICD-10-CM

## 2013-05-26 DIAGNOSIS — R5383 Other fatigue: Secondary | ICD-10-CM

## 2013-05-26 LAB — COMPREHENSIVE METABOLIC PANEL
ALT: 18 U/L (ref 0–35)
AST: 20 U/L (ref 0–37)
Albumin: 3.7 g/dL (ref 3.5–5.2)
Alkaline Phosphatase: 72 U/L (ref 39–117)
BILIRUBIN TOTAL: 0.5 mg/dL (ref 0.3–1.2)
BUN: 18 mg/dL (ref 6–23)
CALCIUM: 9.3 mg/dL (ref 8.4–10.5)
CO2: 29 mEq/L (ref 19–32)
CREATININE: 0.6 mg/dL (ref 0.4–1.2)
Chloride: 107 mEq/L (ref 96–112)
GFR: 114.2 mL/min (ref >60.00)
Glucose, Bld: 88 mg/dL (ref 70–99)
Potassium: 4.1 mEq/L (ref 3.5–5.1)
Sodium: 142 mEq/L (ref 135–145)
Total Protein: 6.9 g/dL (ref 6.0–8.3)

## 2013-05-26 LAB — CBC WITH DIFFERENTIAL/PLATELET
BASOS ABS: 0 10*3/uL (ref 0.0–0.1)
Basophils Relative: 0.4 % (ref 0.0–3.0)
EOS ABS: 0.1 10*3/uL (ref 0.0–0.7)
Eosinophils Relative: 3 % (ref 0.0–5.0)
HEMATOCRIT: 35.1 % — AB (ref 36.0–46.0)
Hemoglobin: 11.5 g/dL — ABNORMAL LOW (ref 12.0–15.0)
LYMPHS ABS: 1.1 10*3/uL (ref 0.7–4.0)
LYMPHS PCT: 25 % (ref 12.0–46.0)
MCHC: 32.7 g/dL (ref 30.0–36.0)
MCV: 84.7 fl (ref 78.0–100.0)
Monocytes Absolute: 0.5 10*3/uL (ref 0.1–1.0)
Monocytes Relative: 11.7 % (ref 3.0–12.0)
NEUTROS ABS: 2.7 10*3/uL (ref 1.4–7.7)
Neutrophils Relative %: 59.9 % (ref 43.0–77.0)
Platelets: 217 10*3/uL (ref 150.0–400.0)
RBC: 4.15 Mil/uL (ref 3.87–5.11)
RDW: 14.6 % (ref 11.5–14.6)
WBC: 4.6 10*3/uL (ref 4.5–10.5)

## 2013-05-26 LAB — LIPID PANEL
CHOL/HDL RATIO: 3
CHOLESTEROL: 175 mg/dL (ref 0–200)
HDL: 68.2 mg/dL (ref >39.00)
LDL Cholesterol: 94 mg/dL (ref 0–99)
Triglycerides: 65 mg/dL (ref 0.0–149.0)
VLDL: 13 mg/dL (ref 0.0–40.0)

## 2013-05-26 LAB — FOLLICLE STIMULATING HORMONE: FSH: 58.7 m[IU]/mL

## 2013-05-26 LAB — LUTEINIZING HORMONE: LH: 28.14 m[IU]/mL

## 2013-05-26 LAB — TSH: TSH: 1.65 u[IU]/mL (ref 0.35–5.50)

## 2013-05-27 ENCOUNTER — Encounter: Payer: Self-pay | Admitting: Internal Medicine

## 2013-05-27 ENCOUNTER — Ambulatory Visit: Payer: 59 | Admitting: Internal Medicine

## 2013-05-27 LAB — VITAMIN D 25 HYDROXY (VIT D DEFICIENCY, FRACTURES): Vit D, 25-Hydroxy: 53 ng/mL (ref 30–89)

## 2013-05-28 ENCOUNTER — Other Ambulatory Visit: Payer: Self-pay | Admitting: *Deleted

## 2013-05-28 MED ORDER — OMEPRAZOLE 40 MG PO CPDR: 40.0000 mg | DELAYED_RELEASE_CAPSULE | Freq: Every day | ORAL | Status: DC

## 2013-05-29 ENCOUNTER — Ambulatory Visit: Payer: 59 | Admitting: Internal Medicine

## 2013-06-03 ENCOUNTER — Ambulatory Visit (INDEPENDENT_AMBULATORY_CARE_PROVIDER_SITE_OTHER): Payer: 59 | Admitting: Internal Medicine

## 2013-06-03 ENCOUNTER — Encounter: Payer: Self-pay | Admitting: Internal Medicine

## 2013-06-03 ENCOUNTER — Ambulatory Visit: Payer: 59 | Admitting: Internal Medicine

## 2013-06-03 VITALS — BP 110/60 | HR 86 | Temp 98.0°F | Resp 16 | Wt 128.0 lb

## 2013-06-03 DIAGNOSIS — J3089 Other allergic rhinitis: Secondary | ICD-10-CM

## 2013-06-03 DIAGNOSIS — Z5320 Procedure and treatment not carried out because of patient's decision for unspecified reasons: Secondary | ICD-10-CM

## 2013-06-03 DIAGNOSIS — N951 Menopausal and female climacteric states: Secondary | ICD-10-CM

## 2013-06-03 DIAGNOSIS — Z780 Asymptomatic menopausal state: Secondary | ICD-10-CM

## 2013-06-03 DIAGNOSIS — K219 Gastro-esophageal reflux disease without esophagitis: Secondary | ICD-10-CM

## 2013-06-03 DIAGNOSIS — E785 Hyperlipidemia, unspecified: Secondary | ICD-10-CM

## 2013-06-03 DIAGNOSIS — Z13820 Encounter for screening for osteoporosis: Secondary | ICD-10-CM

## 2013-06-03 MED ORDER — OMEPRAZOLE 40 MG PO CPDR: 40.0000 mg | DELAYED_RELEASE_CAPSULE | Freq: Every day | ORAL | Status: DC

## 2013-06-03 NOTE — Progress Notes (Signed)
Pre-visit discussion using our clinic review tool. No additional management support is needed unless otherwise documented below in the visit note.  

## 2013-06-03 NOTE — Assessment & Plan Note (Addendum)
Had an allergic reaction to singulair.   She has resumed taking allegra and sudafed once daily

## 2013-06-03 NOTE — Assessment & Plan Note (Addendum)
Confirmed with hormonal studies done after stopping OCPs  .  Cautioned her about the risk of pregnancy until she has been without menses for one year.

## 2013-06-03 NOTE — Assessment & Plan Note (Signed)
Managed with omeprazole.  EGD by Oh.

## 2013-06-03 NOTE — Progress Notes (Signed)
Patient ID: [name] [name], female   DOB: 26-Oct-1955, 58 y.o.   MRN: [id number] Patient Active Problem List   Diagnosis Date Noted  . Allergic reaction to drug 01/01/2013  . Menopause present, declines hormone replacement therapy 04/15/2012  . Allergic rhinitis due to dust 04/14/2012  . Other and unspecified hyperlipidemia 07/17/2011  . Annual physical exam 07/17/2011  . GERD (gastroesophageal reflux disease) 06/07/2011  . Chest pain 06/07/2011    Subjective:  CC:   Chief Complaint  Patient presents with  . Follow-up    labs    HPI:   [name] [name] is a 58 y.o. female who presents for  Follow up on chronic conditions, including allergic rhinitis , menopause. She had an adverse drug reaction to singulair .  Petechial .  Cbc was normal  Resolved . singulair helped her allergies,  But the rash was not tolerable. Has resumed allegra and sudafed prn   Getting ready to use essential oils     Past Medical History  Diagnosis Date  . Hyperlipidemia   . Anxiety   . Allergy     History reviewed. No pertinent past surgical history.     The following portions of the patient's history were reviewed and updated as appropriate: Allergies, current medications, and problem list.    Review of Systems:   Patient denies headache, fevers, malaise, unintentional weight loss, skin rash, eye pain, sinus congestion and sinus pain, sore throat, dysphagia,  hemoptysis , cough, dyspnea, wheezing, chest pain, palpitations, orthopnea, edema, abdominal pain, nausea, melena, diarrhea, constipation, flank pain, dysuria, hematuria, urinary  Frequency, nocturia, numbness, tingling, seizures,  Focal weakness, Loss of consciousness,  Tremor, insomnia, depression, anxiety, and suicidal ideation.     History   Social History  . Marital Status: Married    Spouse Name: N/A    Number of Children: N/A  . Years of Education: N/A   Occupational History  . Not on file.   Social History Main Topics   . Smoking status: Never Smoker   . Smokeless tobacco: Never Used  . Alcohol Use: No  . Drug Use: No  . Sexual Activity: Not on file   Other Topics Concern  . Not on file   Social History Narrative   Lives in [place]. Works at [employer].    Objective:  Filed Vitals:   06/03/13 1531  BP: 110/60  Pulse: 86  Temp: 98 F (36.7 C)  Resp: 16     General appearance: alert, cooperative and appears stated age Ears: normal TM's and external ear canals both ears Throat: lips, mucosa, and tongue normal; teeth and gums normal Neck: no adenopathy, no carotid bruit, supple, symmetrical, trachea midline and thyroid not enlarged, symmetric, no tenderness/mass/nodules Back: symmetric, no curvature. ROM normal. No CVA tenderness. Lungs: clear to auscultation bilaterally Heart: regular rate and rhythm, S1, S2 normal, no murmur, click, rub or gallop Abdomen: soft, non-tender; bowel sounds normal; no masses,  no organomegaly Pulses: 2+ and symmetric Skin: Skin color, texture, turgor normal. No rashes or lesions Lymph nodes: Cervical, supraclavicular, and axillary nodes normal.  Assessment and Plan:  Allergic rhinitis due to dust Had an allergic reaction to singulair.   She has resumed taking allegra and sudafed once daily   Menopause present, declines hormone replacement therapy Confirmed with hormonal studies done after stopping OCPs  .  Cautioned her about the risk of pregnancy until she has been without menses for one year.   GERD (gastroesophageal reflux disease) Managed with omeprazole.  EGD by Oh.    Other and unspecified hyperlipidemia LDL and triglycerides are at goal on diet alone    Lab Results  Component Value Date   CHOL 175 05/26/2013   HDL 68.20 05/26/2013   LDLCALC 94 05/26/2013   TRIG 65.0 05/26/2013   CHOLHDL 3 05/26/2013   A total of 25 minutes was spent with patient more than half of which was spent in counseling, reviewing records from other prviders and coordination of  care.  Updated Medication List Outpatient Encounter Prescriptions as of 06/03/2013  Medication Sig  . cholecalciferol (VITAMIN D) 1000 UNITS tablet Take 1,000 Units by mouth daily.  . cyanocobalamin 100 MCG tablet Take 1,000 mcg by mouth daily.   . fexofenadine (ALLEGRA) 180 MG tablet Take 180 mg by mouth daily as needed.  Marland Kitchen omeprazole (PRILOSEC) 40 MG capsule Take 1 capsule (40 mg total) by mouth daily.  . pseudoephedrine (SUDAFED) 30 MG tablet Take 30 mg by mouth daily as needed for congestion.  . [DISCONTINUED] omeprazole (PRILOSEC) 40 MG capsule Take 1 capsule (40 mg total) by mouth daily.  . [DISCONTINUED] montelukast (SINGULAIR) 10 MG tablet Take 1 tablet (10 mg total) by mouth at bedtime.     Orders Placed This Encounter  Procedures  . DG Bone Density    No Follow-up on file.

## 2013-06-06 ENCOUNTER — Encounter: Payer: Self-pay | Admitting: Internal Medicine

## 2013-06-06 NOTE — Assessment & Plan Note (Signed)
LDL and triglycerides are at goal on diet alone    Lab Results  Component Value Date   CHOL 175 05/26/2013   HDL 68.20 05/26/2013   LDLCALC 94 05/26/2013   TRIG 65.0 05/26/2013   CHOLHDL 3 05/26/2013

## 2013-09-02 ENCOUNTER — Telehealth: Payer: Self-pay | Admitting: Internal Medicine

## 2013-09-02 ENCOUNTER — Encounter: Payer: Self-pay | Admitting: Internal Medicine

## 2013-09-02 NOTE — Telephone Encounter (Signed)
I have not received the DEXA report.  Please request a copy from wherever she had it done

## 2013-09-02 NOTE — Telephone Encounter (Signed)
Please advise 

## 2013-09-02 NOTE — Telephone Encounter (Signed)
Pt came in office to check status of bone density test.  States she saw Dr. Derrel Nip a few months ago and she never heard back about the test.

## 2013-09-04 NOTE — Telephone Encounter (Signed)
Left message for patient to return my call for information to find where Dexa Scan was performed.

## 2013-09-07 NOTE — Telephone Encounter (Signed)
Scheduled for July 8 thfor DEXA Scan.

## 2013-09-07 NOTE — Telephone Encounter (Signed)
Patient was calling because her scan had not been scheduled patient is scheduled now for 09/23/13.

## 2013-09-23 ENCOUNTER — Ambulatory Visit: Payer: Self-pay | Admitting: Internal Medicine

## 2013-09-23 LAB — HM DEXA SCAN

## 2013-09-30 ENCOUNTER — Telehealth: Payer: Self-pay | Admitting: Internal Medicine

## 2013-09-30 DIAGNOSIS — M810 Age-related osteoporosis without current pathological fracture: Secondary | ICD-10-CM | POA: Insufficient documentation

## 2013-09-30 DIAGNOSIS — M8580 Other specified disorders of bone density and structure, unspecified site: Secondary | ICD-10-CM

## 2013-12-01 ENCOUNTER — Encounter: Payer: Self-pay | Admitting: Internal Medicine

## 2013-12-01 DIAGNOSIS — N912 Amenorrhea, unspecified: Secondary | ICD-10-CM

## 2013-12-01 DIAGNOSIS — E785 Hyperlipidemia, unspecified: Secondary | ICD-10-CM

## 2014-02-17 ENCOUNTER — Other Ambulatory Visit (INDEPENDENT_AMBULATORY_CARE_PROVIDER_SITE_OTHER): Payer: 59

## 2014-02-17 DIAGNOSIS — E785 Hyperlipidemia, unspecified: Secondary | ICD-10-CM

## 2014-02-17 DIAGNOSIS — N912 Amenorrhea, unspecified: Secondary | ICD-10-CM

## 2014-02-17 LAB — COMPREHENSIVE METABOLIC PANEL
ALBUMIN: 3.7 g/dL (ref 3.5–5.2)
ALK PHOS: 73 U/L (ref 39–117)
ALT: 16 U/L (ref 0–35)
AST: 19 U/L (ref 0–37)
BUN: 19 mg/dL (ref 6–23)
CALCIUM: 8.9 mg/dL (ref 8.4–10.5)
CHLORIDE: 105 meq/L (ref 96–112)
CO2: 27 mEq/L (ref 19–32)
Creatinine, Ser: 0.7 mg/dL (ref 0.4–1.2)
GFR: 98.12 mL/min (ref >60.00)
Glucose, Bld: 85 mg/dL (ref 70–99)
POTASSIUM: 4.1 meq/L (ref 3.5–5.1)
Sodium: 139 mEq/L (ref 135–145)
Total Bilirubin: 0.4 mg/dL (ref 0.2–1.2)
Total Protein: 6.8 g/dL (ref 6.0–8.3)

## 2014-02-17 LAB — FOLLICLE STIMULATING HORMONE: FSH: 58.4 m[IU]/mL

## 2014-02-17 LAB — LIPID PANEL
CHOL/HDL RATIO: 3
Cholesterol: 175 mg/dL (ref 0–200)
HDL: 57.4 mg/dL (ref >39.00)
LDL CALC: 100 mg/dL — AB (ref 0–99)
NONHDL: 117.6
Triglycerides: 87 mg/dL (ref 0.0–149.0)
VLDL: 17.4 mg/dL (ref 0.0–40.0)

## 2014-02-17 LAB — LUTEINIZING HORMONE: LH: 29.5 m[IU]/mL

## 2014-02-17 LAB — TSH: TSH: 1.8 u[IU]/mL (ref 0.35–4.50)

## 2014-02-19 ENCOUNTER — Encounter: Payer: Self-pay | Admitting: Internal Medicine

## 2014-02-19 ENCOUNTER — Ambulatory Visit (INDEPENDENT_AMBULATORY_CARE_PROVIDER_SITE_OTHER): Payer: 59 | Admitting: Internal Medicine

## 2014-02-19 VITALS — BP 118/78 | HR 69 | Temp 97.8°F | Resp 16 | Ht 64.0 in | Wt 129.5 lb

## 2014-02-19 DIAGNOSIS — Z5320 Procedure and treatment not carried out because of patient's decision for unspecified reasons: Secondary | ICD-10-CM

## 2014-02-19 DIAGNOSIS — Z780 Asymptomatic menopausal state: Secondary | ICD-10-CM

## 2014-02-19 DIAGNOSIS — E785 Hyperlipidemia, unspecified: Secondary | ICD-10-CM

## 2014-02-19 DIAGNOSIS — M8580 Other specified disorders of bone density and structure, unspecified site: Secondary | ICD-10-CM

## 2014-02-19 MED ORDER — OMEPRAZOLE 40 MG PO CPDR
40.0000 mg | DELAYED_RELEASE_CAPSULE | Freq: Every day | ORAL | Status: DC
Start: 2014-02-19 — End: 2014-12-15

## 2014-02-19 NOTE — Progress Notes (Signed)
Patient ID: [name] [name], female   DOB: 06-Sep-1955, 58 y.o.   MRN: [id number]  Patient Active Problem List   Diagnosis Date Noted  . Osteopenia 09/30/2013  . Allergic reaction to drug 01/01/2013  . Menopause present, declines hormone replacement therapy 04/15/2012  . Allergic rhinitis due to dust 04/14/2012  . Hyperlipidemia 07/17/2011  . Annual physical exam 07/17/2011  . GERD (gastroesophageal reflux disease) 06/07/2011  . Chest pain 06/07/2011    Subjective:  CC:   Chief Complaint  Patient presents with  . Follow-up    labs    HPI:   [name] [name] is a 58 y.o. female who presents for  Follow up on chronic issues including hyperlipidemia,  recent onset of menopause and osteopenia   Sister died this year after having recurrent syncope and falls. Husband found her deceased in bed several days after she had had an unwitnessed fall which was not evaluated for head injury, so the  cuase of death was presumed SAH.  Parents both died from CAD ,  At age at 60 and at 6 from CAD  Her other sister diagnosed at 64 with cancer  NHL after having a pathologic vertebral fracture L2     Past Medical History  Diagnosis Date  . Hyperlipidemia   . Anxiety   . Allergy     No past surgical history on file.     The following portions of the patient's history were reviewed and updated as appropriate: Allergies, current medications, and problem list.    Review of Systems:   Patient denies headache, fevers, malaise, unintentional weight loss, skin rash, eye pain, sinus congestion and sinus pain, sore throat, dysphagia,  hemoptysis , cough, dyspnea, wheezing, chest pain, palpitations, orthopnea, edema, abdominal pain, nausea, melena, diarrhea, constipation, flank pain, dysuria, hematuria, urinary  Frequency, nocturia, numbness, tingling, seizures,  Focal weakness, Loss of consciousness,  Tremor, insomnia, depression, anxiety, and suicidal ideation.     History   Social History   . Marital Status: Married    Spouse Name: N/A    Number of Children: N/A  . Years of Education: N/A   Occupational History  . Not on file.   Social History Main Topics  . Smoking status: Never Smoker   . Smokeless tobacco: Never Used  . Alcohol Use: No  . Drug Use: No  . Sexual Activity: Not on file   Other Topics Concern  . Not on file   Social History Narrative   Lives in [place]. Works at [employer].    Objective:  Filed Vitals:   02/19/14 1032  BP: 118/78  Pulse: 69  Temp: 97.8 F (36.6 C)  Resp: 16     General appearance: alert, cooperative and appears stated age Ears: normal TM's and external ear canals both ears Throat: lips, mucosa, and tongue normal; teeth and gums normal Neck: no adenopathy, no carotid bruit, supple, symmetrical, trachea midline and thyroid not enlarged, symmetric, no tenderness/mass/nodules Back: symmetric, no curvature. ROM normal. No CVA tenderness. Lungs: clear to auscultation bilaterally Heart: regular rate and rhythm, S1, S2 normal, no murmur, click, rub or gallop Abdomen: soft, non-tender; bowel sounds normal; no masses,  no organomegaly Pulses: 2+ and symmetric Skin: Skin color, texture, turgor normal. No rashes or lesions Lymph nodes: Cervical, supraclavicular, and axillary nodes normal.  Assessment and Plan:  Osteopenia She has moderate osteopenia by recent DEXA , , T Score was  -2.1  10 yr risk is 6% .  Recommend weight bearing exercise,  1200 mg calcium through diet /supplements and 1000 units Vit D daily  Repeat in 2 years    Hyperlipidemia LDL and triglycerides are at goal on diet alone .  Family history remians her only risk factor for early CAD   Lab Results  Component Value Date   CHOL 175 02/17/2014   HDL 57.40 02/17/2014   LDLCALC 100* 02/17/2014   TRIG 87.0 02/17/2014   CHOLHDL 3 02/17/2014      Menopause present, declines hormone replacement therapy Delayed by continued use of oral contraceptives  Until age  59.   Confirmed with hormonal studies repeated today .  Her last menstrual period was Sept 2014.    Updated Medication List Outpatient Encounter Prescriptions as of 02/19/2014  Medication Sig  . cholecalciferol (VITAMIN D) 1000 UNITS tablet Take 1,000 Units by mouth daily.  . cyanocobalamin 100 MCG tablet Take 1,000 mcg by mouth daily.   . fexofenadine (ALLEGRA) 180 MG tablet Take 180 mg by mouth daily as needed.  Marland Kitchen omeprazole (PRILOSEC) 40 MG capsule Take 1 capsule (40 mg total) by mouth daily.  . pseudoephedrine (SUDAFED) 30 MG tablet Take 30 mg by mouth daily as needed for congestion.  . [DISCONTINUED] omeprazole (PRILOSEC) 40 MG capsule Take 1 capsule (40 mg total) by mouth daily.     No orders of the defined types were placed in this encounter.    No Follow-up on file.

## 2014-02-19 NOTE — Progress Notes (Signed)
Pre-visit discussion using our clinic review tool. No additional management support is needed unless otherwise documented below in the visit note.  

## 2014-02-20 ENCOUNTER — Encounter: Payer: Self-pay | Admitting: Internal Medicine

## 2014-02-20 NOTE — Assessment & Plan Note (Signed)
LDL and triglycerides are at goal on diet alone .  Family history remians her only risk factor for early CAD   Lab Results  Component Value Date   CHOL 175 02/17/2014   HDL 57.40 02/17/2014   LDLCALC 100* 02/17/2014   TRIG 87.0 02/17/2014   CHOLHDL 3 02/17/2014

## 2014-02-20 NOTE — Assessment & Plan Note (Addendum)
Delayed by continued use of oral contraceptives  Until age 58.   Confirmed with hormonal studies repeated today .  Her last menstrual period was Sept 2014.

## 2014-02-20 NOTE — Assessment & Plan Note (Signed)
She has moderate osteopenia by recent DEXA , , T Score was  -2.1  10 yr risk is 6% .  Recommend weight bearing exercise, 1200 mg calcium through diet /supplements and 1000 units Vit D daily  Repeat in 2 years     

## 2014-05-21 ENCOUNTER — Other Ambulatory Visit (HOSPITAL_COMMUNITY)
Admission: RE | Admit: 2014-05-21 | Discharge: 2014-05-21 | Disposition: A | Discharge status: Home / Self Care | Payer: 59 | Source: Ambulatory Visit | Attending: Internal Medicine | Admitting: Internal Medicine

## 2014-05-21 ENCOUNTER — Encounter: Payer: Self-pay | Admitting: Internal Medicine

## 2014-05-21 ENCOUNTER — Ambulatory Visit (INDEPENDENT_AMBULATORY_CARE_PROVIDER_SITE_OTHER): Payer: 59 | Admitting: Internal Medicine

## 2014-05-21 VITALS — BP 108/66 | HR 66 | Temp 97.5°F | Resp 14 | Ht 65.0 in | Wt 129.0 lb

## 2014-05-21 DIAGNOSIS — Z1151 Encounter for screening for human papillomavirus (HPV): Secondary | ICD-10-CM | POA: Insufficient documentation

## 2014-05-21 DIAGNOSIS — Z01419 Encounter for gynecological examination (general) (routine) without abnormal findings: Secondary | ICD-10-CM | POA: Diagnosis not present

## 2014-05-21 DIAGNOSIS — IMO0002 Reserved for concepts with insufficient information to code with codable children: Secondary | ICD-10-CM

## 2014-05-21 DIAGNOSIS — N362 Urethral caruncle: Secondary | ICD-10-CM

## 2014-05-21 DIAGNOSIS — Z124 Encounter for screening for malignant neoplasm of cervix: Secondary | ICD-10-CM

## 2014-05-21 DIAGNOSIS — N941 Dyspareunia: Secondary | ICD-10-CM

## 2014-05-21 DIAGNOSIS — Z0000 Encounter for general adult medical examination without abnormal findings: Secondary | ICD-10-CM

## 2014-05-21 DIAGNOSIS — M8580 Other specified disorders of bone density and structure, unspecified site: Secondary | ICD-10-CM

## 2014-05-21 NOTE — Progress Notes (Signed)
Patient ID: [name] [name], female   DOB: 11-12-1955, 59 y.o.   MRN: [id number]  Subjective:     [name] [name] is a 59 y.o. female and is here for a comprehensive physical exam. The patient reports dyspareunia without vaginal discharge or bleeding. Marland Kitchen  History   Social History  . Marital Status: Married    Spouse Name: N/A  . Number of Children: N/A  . Years of Education: N/A   Occupational History  . Not on file.   Social History Main Topics  . Smoking status: Never Smoker   . Smokeless tobacco: Never Used  . Alcohol Use: No  . Drug Use: No  . Sexual Activity: Not on file   Other Topics Concern  . Not on file   Social History Narrative   Lives in [place]. Works at [employer].   Health Maintenance  Topic Date Due  . HIV Screening  03/03/1972  . PAP SMEAR  07/17/2014  . INFLUENZA VACCINE  10/18/2014  . MAMMOGRAM  01/07/2015  . TETANUS/TDAP  07/28/2015  . COLONOSCOPY  11/11/2021    The following portions of the patient's history were reviewed and updated as appropriate: allergies, current medications, past family history, past medical history, past social history, past surgical history and problem list.  Review of Systems  Patient denies headache, fevers, malaise, unintentional weight loss, skin rash, eye pain, sinus congestion and sinus pain, sore throat, dysphagia,  hemoptysis , cough, dyspnea, wheezing, chest pain, palpitations, orthopnea, edema, abdominal pain, nausea, melena, diarrhea, constipation, flank pain, dysuria, hematuria, urinary  Frequency, nocturia, numbness, tingling, seizures,  Focal weakness, Loss of consciousness,  Tremor, insomnia, depression, anxiety, and suicidal ideation.     Objective:   BP 108/66 mmHg  Pulse 66  Temp(Src) 97.5 F (36.4 C) (Oral)  Resp 14  Ht 5\' 5"  (1.651 m)  Wt 129 lb (58.514 kg)  BMI 21.47 kg/m2  SpO2 98%  LMP 11/20/2012  General Appearance:    Alert, cooperative, no distress, appears stated age  Head:    Normocephalic,  without obvious abnormality, atraumatic  Eyes:    PERRL, conjunctiva/corneas clear, EOM's intact, fundi    benign, both eyes  Ears:    Normal TM's and external ear canals, both ears  Nose:   Nares normal, septum midline, mucosa normal, no drainage    or sinus tenderness  Throat:   Lips, mucosa, and tongue normal; teeth and gums normal  Neck:   Supple, symmetrical, trachea midline, no adenopathy;    thyroid:  no enlargement/tenderness/nodules; no carotid   bruit or JVD  Back:     Symmetric, no curvature, ROM normal, no CVA tenderness  Lungs:     Clear to auscultation bilaterally, respirations unlabored  Chest Wall:    No tenderness or deformity   Heart:    Regular rate and rhythm, S1 and S2 normal, no murmur, rub   or gallop  Breast Exam:    No tenderness, masses, or nipple abnormality  Abdomen:     Soft, non-tender, bowel sounds active all four quadrants,    no masses, no organomegaly  Genitalia:    Pelvic: cervix normal in appearance, external genitalia normal, urethral polyp noted with difficulty passing the speculum , no adnexal masses or tenderness, no cervical motion tenderness, rectovaginal septum normal, uterus normal size, shape, and consistency and vaginal vault narrow without discharge  Extremities:   Extremities normal, atraumatic, no cyanosis or edema  Pulses:   2+ and symmetric all extremities  Skin:  Skin color, texture, turgor normal, no rashes or lesions  Lymph nodes:   Cervical, supraclavicular, and axillary nodes normal  Neurologic:   CNII-XII intact, normal strength, sensation and reflexes    throughout     Assessment and Plan:   Problem List Items Addressed This Visit    Urethral polyp - Primary    May be the cuase of her dysparenia.  Referral to gyn for evaluation  PAP smear done.       Relevant Orders   Ambulatory referral to Gynecology   Osteopenia    She has moderate osteopenia by recent DEXA , , T Score was  -2.1  10 yr risk is 6% .  Recommend weight  bearing exercise, 1200 mg calcium through diet /supplements and 1000 units Vit D daily  Repeat in 2 years          Annual physical exam    Annual wellness  exam was done as well as a comprehensive physical exam and management of acute and chronic conditions .  During the course of the visit the patient was educated and counseled about appropriate screening and preventive services including :  diabetes screening, lipid analysis with projected  10 year  risk for CAD , nutrition counseling, colorectal cancer screening, and recommended immunizations.  Printed recommendations for health maintenance screenings was given.   Lab Results  Component Value Date   CHOL 175 02/17/2014   HDL 57.40 02/17/2014   LDLCALC 100* 02/17/2014   TRIG 87.0 02/17/2014   CHOLHDL 3 02/17/2014   Lab Results  Component Value Date   ALT 16 02/17/2014   AST 19 02/17/2014   ALKPHOS 73 02/17/2014   BILITOT 0.4 02/17/2014   Lab Results  Component Value Date   TSH 1.80 02/17/2014           Other Visit Diagnoses    Dyspareunia        Relevant Orders    Ambulatory referral to Gynecology    Screening for cervical cancer        Relevant Orders    Cytology - PAP

## 2014-05-21 NOTE — Patient Instructions (Signed)
Referal to dr Garwin Brothers in process for evaluation of urethral polyp  Health Maintenance Adopting a healthy lifestyle and getting preventive care can go a long way to promote health and wellness. Talk with your health care provider about what schedule of regular examinations is right for you. This is a good chance for you to check in with your provider about disease prevention and staying healthy. In between checkups, there are plenty of things you can do on your own. Experts have done a lot of research about which lifestyle changes and preventive measures are most likely to keep you healthy. Ask your health care provider for more information. WEIGHT AND DIET  Eat a healthy diet  Be sure to include plenty of vegetables, fruits, low-fat dairy products, and lean protein.  Do not eat a lot of foods high in solid fats, added sugars, or salt.  Get regular exercise. This is one of the most important things you can do for your health.  Most adults should exercise for at least 150 minutes each week. The exercise should increase your heart rate and make you sweat (moderate-intensity exercise).  Most adults should also do strengthening exercises at least twice a week. This is in addition to the moderate-intensity exercise.  Maintain a healthy weight  Body mass index (BMI) is a measurement that can be used to identify possible weight problems. It estimates body fat based on height and weight. Your health care provider can help determine your BMI and help you achieve or maintain a healthy weight.  For females 75 years of age and older:   A BMI below 18.5 is considered underweight.  A BMI of 18.5 to 24.9 is normal.  A BMI of 25 to 29.9 is considered overweight.  A BMI of 30 and above is considered obese.  Watch levels of cholesterol and blood lipids  You should start having your blood tested for lipids and cholesterol at 58 years of age, then have this test every 5 years.  You may need to have  your cholesterol levels checked more often if:  Your lipid or cholesterol levels are high.  You are older than 58 years of age.  You are at high risk for heart disease.  CANCER SCREENING   Lung Cancer  Lung cancer screening is recommended for adults 20-1 years old who are at high risk for lung cancer because of a history of smoking.  A yearly low-dose CT scan of the lungs is recommended for people who:  Currently smoke.  Have quit within the past 15 years.  Have at least a 30-pack-year history of smoking. A pack year is smoking an average of one pack of cigarettes a day for 1 year.  Yearly screening should continue until it has been 15 years since you quit.  Yearly screening should stop if you develop a health problem that would prevent you from having lung cancer treatment.  Breast Cancer  Practice breast self-awareness. This means understanding how your breasts normally appear and feel.  It also means doing regular breast self-exams. Let your health care provider know about any changes, no matter how small.  If you are in your 20s or 30s, you should have a clinical breast exam (CBE) by a health care provider every 1-3 years as part of a regular health exam.  If you are 54 or older, have a CBE every year. Also consider having a breast X-ray (mammogram) every year.  If you have a family history of breast cancer, talk  to your health care provider about genetic screening.  If you are at high risk for breast cancer, talk to your health care provider about having an MRI and a mammogram every year.  Breast cancer gene (BRCA) assessment is recommended for women who have family members with BRCA-related cancers. BRCA-related cancers include:  Breast.  Ovarian.  Tubal.  Peritoneal cancers.  Results of the assessment will determine the need for genetic counseling and BRCA1 and BRCA2 testing. Cervical Cancer Routine pelvic examinations to screen for cervical cancer are no  longer recommended for nonpregnant women who are considered low risk for cancer of the pelvic organs (ovaries, uterus, and vagina) and who do not have symptoms. A pelvic examination may be necessary if you have symptoms including those associated with pelvic infections. Ask your health care provider if a screening pelvic exam is right for you.   The Pap test is the screening test for cervical cancer for women who are considered at risk.  If you had a hysterectomy for a problem that was not cancer or a condition that could lead to cancer, then you no longer need Pap tests.  If you are older than 65 years, and you have had normal Pap tests for the past 10 years, you no longer need to have Pap tests.  If you have had past treatment for cervical cancer or a condition that could lead to cancer, you need Pap tests and screening for cancer for at least 20 years after your treatment.  If you no longer get a Pap test, assess your risk factors if they change (such as having a new sexual partner). This can affect whether you should start being screened again.  Some women have medical problems that increase their chance of getting cervical cancer. If this is the case for you, your health care provider may recommend more frequent screening and Pap tests.  The human papillomavirus (HPV) test is another test that may be used for cervical cancer screening. The HPV test looks for the virus that can cause cell changes in the cervix. The cells collected during the Pap test can be tested for HPV.  The HPV test can be used to screen women 84 years of age and older. Getting tested for HPV can extend the interval between normal Pap tests from three to five years.  An HPV test also should be used to screen women of any age who have unclear Pap test results.  After 58 years of age, women should have HPV testing as often as Pap tests.  Colorectal Cancer  This type of cancer can be detected and often  prevented.  Routine colorectal cancer screening usually begins at 58 years of age and continues through 58 years of age.  Your health care provider may recommend screening at an earlier age if you have risk factors for colon cancer.  Your health care provider may also recommend using home test kits to check for hidden blood in the stool.  A small camera at the end of a tube can be used to examine your colon directly (sigmoidoscopy or colonoscopy). This is done to check for the earliest forms of colorectal cancer.  Routine screening usually begins at age 61.  Direct examination of the colon should be repeated every 5-10 years through 58 years of age. However, you may need to be screened more often if early forms of precancerous polyps or small growths are found. Skin Cancer  Check your skin from head to toe regularly.  Tell your health care provider about any new moles or changes in moles, especially if there is a change in a mole's shape or color.  Also tell your health care provider if you have a mole that is larger than the size of a pencil eraser.  Always use sunscreen. Apply sunscreen liberally and repeatedly throughout the day.  Protect yourself by wearing long sleeves, pants, a wide-brimmed hat, and sunglasses whenever you are outside. HEART DISEASE, DIABETES, AND HIGH BLOOD PRESSURE   Have your blood pressure checked at least every 1-2 years. High blood pressure causes heart disease and increases the risk of stroke.  If you are between 72 years and 44 years old, ask your health care provider if you should take aspirin to prevent strokes.  Have regular diabetes screenings. This involves taking a blood sample to check your fasting blood sugar level.  If you are at a normal weight and have a low risk for diabetes, have this test once every three years after 58 years of age.  If you are overweight and have a high risk for diabetes, consider being tested at a younger age or more  often. PREVENTING INFECTION  Hepatitis B  If you have a higher risk for hepatitis B, you should be screened for this virus. You are considered at high risk for hepatitis B if:  You were born in a country where hepatitis B is common. Ask your health care provider which countries are considered high risk.  Your parents were born in a high-risk country, and you have not been immunized against hepatitis B (hepatitis B vaccine).  You have HIV or AIDS.  You use needles to inject street drugs.  You live with someone who has hepatitis B.  You have had sex with someone who has hepatitis B.  You get hemodialysis treatment.  You take certain medicines for conditions, including cancer, organ transplantation, and autoimmune conditions. Hepatitis C  Blood testing is recommended for:  Everyone born from [birth year] through 1964.  Anyone with known risk factors for hepatitis C. Sexually transmitted infections (STIs)  You should be screened for sexually transmitted infections (STIs) including gonorrhea and chlamydia if:  You are sexually active and are younger than 58 years of age.  You are older than 58 years of age and your health care provider tells you that you are at risk for this type of infection.  Your sexual activity has changed since you were last screened and you are at an increased risk for chlamydia or gonorrhea. Ask your health care provider if you are at risk.  If you do not have HIV, but are at risk, it may be recommended that you take a prescription medicine daily to prevent HIV infection. This is called pre-exposure prophylaxis (PrEP). You are considered at risk if:  You are sexually active and do not regularly use condoms or know the HIV status of your partner(s).  You take drugs by injection.  You are sexually active with a partner who has HIV. Talk with your health care provider about whether you are at high risk of being infected with HIV. If you choose to begin PrEP, you  should first be tested for HIV. You should then be tested every 3 months for as long as you are taking PrEP.  PREGNANCY   If you are premenopausal and you may become pregnant, ask your health care provider about preconception counseling.  If you may become pregnant, take 400 to 800 micrograms (mcg) of folic acid  every day.  If you want to prevent pregnancy, talk to your health care provider about birth control (contraception). OSTEOPOROSIS AND MENOPAUSE   Osteoporosis is a disease in which the bones lose minerals and strength with aging. This can result in serious bone fractures. Your risk for osteoporosis can be identified using a bone density scan.  If you are 63 years of age or older, or if you are at risk for osteoporosis and fractures, ask your health care provider if you should be screened.  Ask your health care provider whether you should take a calcium or vitamin D supplement to lower your risk for osteoporosis.  Menopause may have certain physical symptoms and risks.  Hormone replacement therapy may reduce some of these symptoms and risks. Talk to your health care provider about whether hormone replacement therapy is right for you.  HOME CARE INSTRUCTIONS   Schedule regular health, dental, and eye exams.  Stay current with your immunizations.   Do not use any tobacco products including cigarettes, chewing tobacco, or electronic cigarettes.  If you are pregnant, do not drink alcohol.  If you are breastfeeding, limit how much and how often you drink alcohol.  Limit alcohol intake to no more than 1 drink per day for nonpregnant women. One drink equals 12 ounces of beer, 5 ounces of wine, or 1 ounces of hard liquor.  Do not use street drugs.  Do not share needles.  Ask your health care provider for help if you need support or information about quitting drugs.  Tell your health care provider if you often feel depressed.  Tell your health care provider if you have ever  been abused or do not feel safe at home. Document Released: 09/18/2010 Document Revised: 07/20/2013 Document Reviewed: 02/04/2013 [facility] Patient Information 2015 [place], [place]. This information is not intended to replace advice given to you by your health care provider. Make sure you discuss any questions you have with your health care provider.

## 2014-05-21 NOTE — Progress Notes (Signed)
Pre-visit discussion using our clinic review tool. No additional management support is needed unless otherwise documented below in the visit note.  

## 2014-05-23 ENCOUNTER — Encounter: Payer: Self-pay | Admitting: Internal Medicine

## 2014-05-23 DIAGNOSIS — N362 Urethral caruncle: Secondary | ICD-10-CM | POA: Insufficient documentation

## 2014-05-23 NOTE — Assessment & Plan Note (Signed)
Annual wellness  exam was done as well as a comprehensive physical exam and management of acute and chronic conditions .  During the course of the visit the patient was educated and counseled about appropriate screening and preventive services including :  diabetes screening, lipid analysis with projected  10 year  risk for CAD , nutrition counseling, colorectal cancer screening, and recommended immunizations.  Printed recommendations for health maintenance screenings was given.   Lab Results  Component Value Date   CHOL 175 02/17/2014   HDL 57.40 02/17/2014   LDLCALC 100* 02/17/2014   TRIG 87.0 02/17/2014   CHOLHDL 3 02/17/2014   Lab Results  Component Value Date   ALT 16 02/17/2014   AST 19 02/17/2014   ALKPHOS 73 02/17/2014   BILITOT 0.4 02/17/2014   Lab Results  Component Value Date   TSH 1.80 02/17/2014

## 2014-05-23 NOTE — Assessment & Plan Note (Signed)
She has moderate osteopenia by recent DEXA , , T Score was  -2.1  10 yr risk is 6% .  Recommend weight bearing exercise, 1200 mg calcium through diet /supplements and 1000 units Vit D daily  Repeat in 2 years

## 2014-05-23 NOTE — Assessment & Plan Note (Signed)
May be the cuase of her dysparenia.  Referral to gyn for evaluation  PAP smear done.

## 2014-05-25 LAB — CYTOLOGY - PAP

## 2014-05-27 ENCOUNTER — Encounter: Payer: Self-pay | Admitting: Internal Medicine

## 2014-12-15 ENCOUNTER — Other Ambulatory Visit: Payer: Self-pay | Admitting: Internal Medicine

## 2015-05-26 ENCOUNTER — Other Ambulatory Visit: Payer: Self-pay | Admitting: Internal Medicine

## 2015-05-26 DIAGNOSIS — Z1231 Encounter for screening mammogram for malignant neoplasm of breast: Secondary | ICD-10-CM

## 2015-05-31 ENCOUNTER — Other Ambulatory Visit: Payer: Self-pay | Admitting: Internal Medicine

## 2015-05-31 ENCOUNTER — Ambulatory Visit
Admission: RE | Admit: 2015-05-31 | Discharge: 2015-05-31 | Disposition: A | Discharge status: Home / Self Care | Payer: 59 | Source: Ambulatory Visit | Attending: Internal Medicine | Admitting: Internal Medicine

## 2015-05-31 DIAGNOSIS — Z1231 Encounter for screening mammogram for malignant neoplasm of breast: Secondary | ICD-10-CM

## 2015-06-15 ENCOUNTER — Telehealth: Payer: Self-pay | Admitting: Internal Medicine

## 2015-06-15 DIAGNOSIS — M8580 Other specified disorders of bone density and structure, unspecified site: Secondary | ICD-10-CM

## 2015-06-15 DIAGNOSIS — R5383 Other fatigue: Secondary | ICD-10-CM

## 2015-06-15 DIAGNOSIS — E785 Hyperlipidemia, unspecified: Secondary | ICD-10-CM

## 2015-06-15 DIAGNOSIS — E559 Vitamin D deficiency, unspecified: Secondary | ICD-10-CM

## 2015-06-15 NOTE — Telephone Encounter (Signed)
LMOMTCB and make an appointment for lab

## 2015-06-15 NOTE — Telephone Encounter (Signed)
Pt would like to have labs done before her CPE appt there are no labs in the system.. Please advise so we can schedule

## 2015-06-15 NOTE — Telephone Encounter (Signed)
Does pt need hormone check as well? Please advise, thanks

## 2015-06-15 NOTE — Telephone Encounter (Signed)
Signed,  Thanks ,  No to hormones

## 2015-07-14 ENCOUNTER — Other Ambulatory Visit (INDEPENDENT_AMBULATORY_CARE_PROVIDER_SITE_OTHER): Payer: 59

## 2015-07-14 DIAGNOSIS — E785 Hyperlipidemia, unspecified: Secondary | ICD-10-CM

## 2015-07-14 DIAGNOSIS — R5383 Other fatigue: Secondary | ICD-10-CM | POA: Diagnosis not present

## 2015-07-14 DIAGNOSIS — E559 Vitamin D deficiency, unspecified: Secondary | ICD-10-CM

## 2015-07-14 LAB — COMPREHENSIVE METABOLIC PANEL
ALT: 11 U/L (ref 0–35)
AST: 14 U/L (ref 0–37)
Albumin: 3.8 g/dL (ref 3.5–5.2)
Alkaline Phosphatase: 60 U/L (ref 39–117)
BUN: 20 mg/dL (ref 6–23)
CALCIUM: 9.1 mg/dL (ref 8.4–10.5)
CHLORIDE: 106 meq/L (ref 96–112)
CO2: 30 meq/L (ref 19–32)
Creatinine, Ser: 0.61 mg/dL (ref 0.40–1.20)
GFR: 106.94 mL/min (ref >60.00)
Glucose, Bld: 94 mg/dL (ref 70–99)
Potassium: 3.9 mEq/L (ref 3.5–5.1)
Sodium: 141 mEq/L (ref 135–145)
Total Bilirubin: 0.3 mg/dL (ref 0.2–1.2)
Total Protein: 7.3 g/dL (ref 6.0–8.3)

## 2015-07-14 LAB — CBC WITH DIFFERENTIAL/PLATELET
BASOS PCT: 0.3 % (ref 0.0–3.0)
Basophils Absolute: 0 10*3/uL (ref 0.0–0.1)
EOS PCT: 1.8 % (ref 0.0–5.0)
Eosinophils Absolute: 0.1 10*3/uL (ref 0.0–0.7)
HCT: 34.4 % — ABNORMAL LOW (ref 36.0–46.0)
HEMOGLOBIN: 11.4 g/dL — AB (ref 12.0–15.0)
Lymphocytes Relative: 31.1 % (ref 12.0–46.0)
Lymphs Abs: 1.4 10*3/uL (ref 0.7–4.0)
MCHC: 33.1 g/dL (ref 30.0–36.0)
MCV: 80.4 fl (ref 78.0–100.0)
MONO ABS: 0.5 10*3/uL (ref 0.1–1.0)
Monocytes Relative: 10.2 % (ref 3.0–12.0)
NEUTROS PCT: 56.6 % (ref 43.0–77.0)
Neutro Abs: 2.6 10*3/uL (ref 1.4–7.7)
Platelets: 237 10*3/uL (ref 150.0–400.0)
RBC: 4.28 Mil/uL (ref 3.87–5.11)
RDW: 13.9 % (ref 11.5–15.5)
WBC: 4.6 10*3/uL (ref 4.0–10.5)

## 2015-07-14 LAB — VITAMIN D 25 HYDROXY (VIT D DEFICIENCY, FRACTURES): VITD: 27.32 ng/mL — ABNORMAL LOW (ref 30.00–100.00)

## 2015-07-14 LAB — LIPID PANEL
CHOLESTEROL: 163 mg/dL (ref 0–200)
HDL: 65.8 mg/dL (ref >39.00)
LDL CALC: 83 mg/dL (ref 0–99)
NonHDL: 97.44
TRIGLYCERIDES: 70 mg/dL (ref 0.0–149.0)
Total CHOL/HDL Ratio: 2
VLDL: 14 mg/dL (ref 0.0–40.0)

## 2015-07-14 LAB — TSH: TSH: 1.12 u[IU]/mL (ref 0.35–4.50)

## 2015-07-15 ENCOUNTER — Encounter: Payer: Self-pay | Admitting: Internal Medicine

## 2015-07-19 ENCOUNTER — Ambulatory Visit (INDEPENDENT_AMBULATORY_CARE_PROVIDER_SITE_OTHER): Payer: 59 | Admitting: Internal Medicine

## 2015-07-19 ENCOUNTER — Encounter: Payer: Self-pay | Admitting: Internal Medicine

## 2015-07-19 VITALS — BP 124/76 | HR 74 | Temp 98.0°F | Resp 12 | Ht 64.0 in | Wt 126.8 lb

## 2015-07-19 DIAGNOSIS — I8393 Asymptomatic varicose veins of bilateral lower extremities: Secondary | ICD-10-CM

## 2015-07-19 DIAGNOSIS — Z0000 Encounter for general adult medical examination without abnormal findings: Secondary | ICD-10-CM | POA: Diagnosis not present

## 2015-07-19 NOTE — Patient Instructions (Signed)

## 2015-07-19 NOTE — Progress Notes (Signed)
Pre-visit discussion using our clinic review tool. No additional management support is needed unless otherwise documented below in the visit note.  

## 2015-07-19 NOTE — Progress Notes (Signed)
Patient ID: [name] [name], female    DOB: Jul 07, 1955  Age: 60 y.o. MRN: [id number]  The patient is here for annual Medicare wellness examination and management of other chronic and acute problems  PAP normal 2016. Normal colon 2013 Oh  mammogram normal marcrh 2017  The risk factors are reflected in the social history.  The roster of all physicians providing medical care to patient - is listed in the Snapshot section of the chart.  Activities of daily living:  The patient is 100% independent in all ADLs: dressing, toileting, feeding as well as independent mobility  Home safety : The patient has smoke detectors in the home. They wear seatbelts.  There are no firearms at home. There is no violence in the home.   There is no risks for hepatitis, STDs or HIV. There is no   history of blood transfusion. They have no travel history to infectious disease endemic areas of the world.  The patient has seen their dentist in the last six month. They have seen their eye doctor in the last year. They admit to slight hearing difficulty with regard to whispered voices and some television programs.  They have deferred audiologic testing in the last year.  They do not  have excessive sun exposure. Discussed the need for sun protection: hats, long sleeves and use of sunscreen if there is significant sun exposure.   Diet: the importance of a healthy diet is discussed. They do have a healthy diet.  The benefits of regular aerobic exercise were discussed. She does not exercise regularly  But takes   The stairs .one flight daily at work .   Depression screen: there are no signs or vegative symptoms of depression- irritability, change in appetite, anhedonia, sadness/tearfullness.  Cognitive assessment: the patient manages all their financial and personal affairs and is actively engaged. They could relate day,date,year and events; recalled 2/3 objects at 3 minutes; performed clock-face test normally.  The  following portions of the patient's history were reviewed and updated as appropriate: allergies, current medications, past family history, past medical history,  past surgical history, past social history  and problem list.  Visual acuity was not assessed per patient preference since she has regular follow up with her ophthalmologist. Hearing and body mass index were assessed and reviewed.   During the course of the visit the patient was educated and counseled about appropriate screening and preventive services including : fall prevention , diabetes screening, nutrition counseling, colorectal cancer screening, and recommended immunizations.    CC: The primary encounter diagnosis was Varicose veins of both lower extremities. A diagnosis of Annual physical exam was also pertinent to this visit.   Varicose veins have become somewhat painful.  Wants vascular surgery referral. Toenail  Fungus Recurrent dizziness,  Not true vertigo takes allegra daily  Uses sudafed nearly daily for congestion    History Reitha has a past medical history of Hyperlipidemia; Anxiety; and Allergy.   She has no past surgical history on file.   Her family history includes COPD in her mother and sister; Cancer in her mother; Cancer (age of onset: 33) in her sister; Diabetes in her brother and mother; Early death in her father; Heart disease in her mother; Heart disease (age of onset: 14) in her father; Hyperlipidemia in her father and mother; Stroke in her maternal grandmother and sister.She reports that she has never smoked. She has never used smokeless tobacco. She reports that she does not drink alcohol or use illicit drugs.  Outpatient Prescriptions Prior to Visit  Medication Sig Dispense Refill  . cholecalciferol (VITAMIN D) 1000 UNITS tablet Take 2,000 Units by mouth daily.     . cyanocobalamin 100 MCG tablet Take 2,000 mcg by mouth daily.     . fexofenadine (ALLEGRA) 180 MG tablet Take 180 mg by mouth daily as  needed.    . magic mouthwash SOLN   1  . omeprazole (PRILOSEC) 40 MG capsule TAKE 1 CAPSULE BY MOUTH DAILY 90 capsule 3  . PAROEX 0.12 % solution   1  . pseudoephedrine (SUDAFED) 30 MG tablet Take 30 mg by mouth daily as needed for congestion.     No facility-administered medications prior to visit.    Review of Systems   Patient denies headache, fevers, malaise, unintentional weight loss, skin rash, eye pain, sinus congestion and sinus pain, sore throat, dysphagia,  hemoptysis , cough, dyspnea, wheezing, chest pain, palpitations, orthopnea, edema, abdominal pain, nausea, melena, diarrhea, constipation, flank pain, dysuria, hematuria, urinary  Frequency, nocturia, numbness, tingling, seizures,  Focal weakness, Loss of consciousness,  Tremor, insomnia, depression, anxiety, and suicidal ideation.      Objective:  BP 124/76 mmHg  Pulse 74  Temp(Src) 98 F (36.7 C) (Oral)  Resp 12  Ht 5\' 4"  (1.626 m)  Wt 126 lb 12 oz (57.493 kg)  BMI 21.75 kg/m2  SpO2 97%  LMP 11/20/2012  Physical Exam   General appearance: alert, cooperative and appears stated age Head: Normocephalic, without obvious abnormality, atraumatic Eyes: conjunctivae/corneas clear. PERRL, EOM's intact. Fundi benign. Ears: normal TM's and external ear canals both ears Nose: Nares normal. Septum midline. Mucosa normal. No drainage or sinus tenderness. Throat: lips, mucosa, and tongue normal; teeth and gums normal Neck: no adenopathy, no carotid bruit, no JVD, supple, symmetrical, trachea midline and thyroid not enlarged, symmetric, no tenderness/mass/nodules Lungs: clear to auscultation bilaterally Breasts: normal appearance, no masses or tenderness Heart: regular rate and rhythm, S1, S2 normal, no murmur, click, rub or gallop Abdomen: soft, non-tender; bowel sounds normal; no masses,  no organomegaly Extremities: extremities normal, atraumatic, no cyanosis or edema Pulses: 2+ and symmetric Skin: Skin color, texture,  turgor normal. No rashes or lesions Neurologic: Alert and oriented X 3, normal strength and tone. Normal symmetric reflexes. Normal coordination and gait.     Assessment & Plan:   Problem List Items Addressed This Visit    Annual physical exam    Annual comprehensive preventive exam was done as well as an evaluation and management of chronic conditions .  During the course of the visit the patient was educated and counseled about appropriate screening and preventive services including :  diabetes screening, lipid analysis with projected  10 year  risk for CAD , nutrition counseling, breast, cervical and colorectal cancer screening, and recommended immunizations.  Printed recommendations for health maintenance screenings was given.  Lab Results  Component Value Date   CHOL 163 07/14/2015   HDL 65.80 07/14/2015   LDLCALC 83 07/14/2015   TRIG 70.0 07/14/2015   CHOLHDL 2 07/14/2015         Varicose veins of both lower extremities - Primary    She has been experiencing moderate pain and Referral to vascular surgery requested.       Relevant Orders   Ambulatory referral to Vascular Surgery      I am having Ms. Payer maintain her cyanocobalamin, cholecalciferol, fexofenadine, pseudoephedrine, omeprazole, magic mouthwash, and PAROEX.  No orders of the defined types were placed in this encounter.  There are no discontinued medications.  Follow-up: No Follow-up on file.   [name], MD

## 2015-07-20 DIAGNOSIS — I8393 Asymptomatic varicose veins of bilateral lower extremities: Secondary | ICD-10-CM | POA: Insufficient documentation

## 2015-07-20 NOTE — Assessment & Plan Note (Signed)
She has been experiencing moderate pain and Referral to vascular surgery requested.

## 2015-07-20 NOTE — Assessment & Plan Note (Signed)
Annual comprehensive preventive exam was done as well as an evaluation and management of chronic conditions .  During the course of the visit the patient was educated and counseled about appropriate screening and preventive services including :  diabetes screening, lipid analysis with projected  10 year  risk for CAD , nutrition counseling, breast, cervical and colorectal cancer screening, and recommended immunizations.  Printed recommendations for health maintenance screenings was given.  Lab Results  Component Value Date   CHOL 163 07/14/2015   HDL 65.80 07/14/2015   LDLCALC 83 07/14/2015   TRIG 70.0 07/14/2015   CHOLHDL 2 07/14/2015

## 2015-09-30 DIAGNOSIS — M79609 Pain in unspecified limb: Secondary | ICD-10-CM | POA: Diagnosis not present

## 2015-09-30 DIAGNOSIS — I83813 Varicose veins of bilateral lower extremities with pain: Secondary | ICD-10-CM | POA: Diagnosis not present

## 2015-10-10 NOTE — Telephone Encounter (Signed)
My Chart message sent

## 2015-10-26 DIAGNOSIS — M79609 Pain in unspecified limb: Secondary | ICD-10-CM | POA: Diagnosis not present

## 2015-10-26 DIAGNOSIS — I8312 Varicose veins of left lower extremity with inflammation: Secondary | ICD-10-CM | POA: Diagnosis not present

## 2015-10-26 DIAGNOSIS — M7989 Other specified soft tissue disorders: Secondary | ICD-10-CM | POA: Diagnosis not present

## 2015-10-26 DIAGNOSIS — I8311 Varicose veins of right lower extremity with inflammation: Secondary | ICD-10-CM | POA: Diagnosis not present

## 2015-10-26 DIAGNOSIS — I83813 Varicose veins of bilateral lower extremities with pain: Secondary | ICD-10-CM | POA: Diagnosis not present

## 2015-10-26 DIAGNOSIS — R42 Dizziness and giddiness: Secondary | ICD-10-CM | POA: Diagnosis not present

## 2015-12-29 ENCOUNTER — Encounter: Payer: Self-pay | Admitting: Family

## 2015-12-29 ENCOUNTER — Ambulatory Visit (INDEPENDENT_AMBULATORY_CARE_PROVIDER_SITE_OTHER): Payer: 59 | Admitting: Family

## 2015-12-29 VITALS — BP 130/76 | HR 92 | Temp 98.0°F | Ht 64.0 in | Wt 129.2 lb

## 2015-12-29 DIAGNOSIS — J020 Streptococcal pharyngitis: Secondary | ICD-10-CM

## 2015-12-29 DIAGNOSIS — N644 Mastodynia: Secondary | ICD-10-CM

## 2015-12-29 LAB — POCT RAPID STREP A (OFFICE): RAPID STREP A SCREEN: POSITIVE — AB

## 2015-12-29 MED ORDER — AMOXICILLIN 500 MG PO TABS: ORAL_TABLET | ORAL | 0 refills | Status: DC

## 2015-12-29 NOTE — Progress Notes (Signed)
Pre visit review using our clinic review tool, if applicable. No additional management support is needed unless otherwise documented below in the visit note. 

## 2015-12-29 NOTE — Patient Instructions (Addendum)
Happy to see you today  Please continue to use slat water gargles. Please let me know how you're doing.  As we decided, we will not do an ultrasound of the right breast at this time however you will let me know if the tingling persists, and I will order an ultrasound for you. If there is no improvement in your symptoms, or if there is any worsening of symptoms, or if you have any additional concerns, please return for re-evaluation; or, if we are closed, consider going to the Emergency Room for evaluation if symptoms urgent.   Strep Throat Strep throat is a bacterial infection of the throat. Your health care provider may call the infection tonsillitis or pharyngitis, depending on whether there is swelling in the tonsils or at the back of the throat. Strep throat is most common during the cold months of the year in children who are 6-53 years of age, but it can happen during any season in people of any age. This infection is spread from person to person (contagious) through coughing, sneezing, or close contact. CAUSES Strep throat is caused by the bacteria called Streptococcus pyogenes. RISK FACTORS This condition is more likely to develop in:  People who spend time in crowded places where the infection can spread easily.  People who have close contact with someone who has strep throat. SYMPTOMS Symptoms of this condition include:  Fever or chills.   Redness, swelling, or pain in the tonsils or throat.  Pain or difficulty when swallowing.  White or yellow spots on the tonsils or throat.  Swollen, tender glands in the neck or under the jaw.  Red rash all over the body (rare). DIAGNOSIS This condition is diagnosed by performing a rapid strep test or by taking a swab of your throat (throat culture test). Results from a rapid strep test are usually ready in a few minutes, but throat culture test results are available after one or two days. TREATMENT This condition is treated with  antibiotic medicine. HOME CARE INSTRUCTIONS Medicines  Take over-the-counter and prescription medicines only as told by your health care provider.  Take your antibiotic as told by your health care provider. Do not stop taking the antibiotic even if you start to feel better.  Have family members who also have a sore throat or fever tested for strep throat. They may need antibiotics if they have the strep infection. Eating and Drinking  Do not share food, drinking cups, or personal items that could cause the infection to spread to other people.  If swallowing is difficult, try eating soft foods until your sore throat feels better.  Drink enough fluid to keep your urine clear or pale yellow. General Instructions  Gargle with a salt-water mixture 3-4 times per day or as needed. To make a salt-water mixture, completely dissolve -1 tsp of salt in 1 cup of warm water.  Make sure that all household members wash their hands well.  Get plenty of rest.  Stay home from school or work until you have been taking antibiotics for 24 hours.  Keep all follow-up visits as told by your health care provider. This is important. SEEK MEDICAL CARE IF:  The glands in your neck continue to get bigger.  You develop a rash, cough, or earache.  You cough up a thick liquid that is green, yellow-brown, or bloody.  You have pain or discomfort that does not get better with medicine.  Your problems seem to be getting worse rather than better.  You have a fever. SEEK IMMEDIATE MEDICAL CARE IF:  You have new symptoms, such as vomiting, severe headache, stiff or painful neck, chest pain, or shortness of breath.  You have severe throat pain, drooling, or changes in your voice.  You have swelling of the neck, or the skin on the neck becomes red and tender.  You have signs of dehydration, such as fatigue, dry mouth, and decreased urination.  You become increasingly sleepy, or you cannot wake up  completely.  Your joints become red or painful.   This information is not intended to replace advice given to you by your health care provider. Make sure you discuss any questions you have with your health care provider.   Document Released: 03/02/2000 Document Revised: 11/24/2014 Document Reviewed: 06/28/2014 Elsevier Interactive Patient Education [employer].

## 2015-12-29 NOTE — Progress Notes (Signed)
Subjective:    Patient ID: [name] [name], female    DOB: Nov 16, 1955, 60 y.o.   MRN: [id number]  CC: [name] [name] is a 60 y.o. female who presents today for an acute visit.    HPI: Patient here for acute visit chief complaint of right breast 'tingling.' 'Feels like vein.' Not a pain . Feels sensitive when right arm rubs across it. Every now and then feels 'tingle in left breast.' Onset 7 months ago after her mammogram. I reviewed mammogram and there was no evidence of malignancy.  Unable to appreciate any mass when she checks breast. No rash, fever, unintentional weight loss.    Patient also complains of sore throat, improving with salt water gargles. Occasional post nasal drainage. No sinus pressure, ear pain, fever.      HISTORY:  Past Medical History:  Diagnosis Date  . Allergy   . Anxiety   . Hyperlipidemia    History reviewed. No pertinent surgical history. Family History  Problem Relation Age of Onset  . Hyperlipidemia Mother   . Diabetes Mother   . COPD Mother   . Heart disease Mother     smoker  . Cancer Mother     lung CA mets   . Hyperlipidemia Father   . Heart disease Father 72    ami, smoker  . Early death Father   . Stroke Maternal Grandmother   . Stroke Sister   . COPD Sister   . Cancer Sister 45    lymphoma  . Diabetes Brother     Allergies: Codeine and Singulair [montelukast sodium] Current Outpatient Prescriptions on File Prior to Visit  Medication Sig Dispense Refill  . magic mouthwash SOLN   1  . PAROEX 0.12 % solution   1   No current facility-administered medications on file prior to visit.     Social History  Substance Use Topics  . Smoking status: Never Smoker  . Smokeless tobacco: Never Used  . Alcohol use No    Review of Systems  Constitutional: Negative for chills and fever.  HENT: Positive for postnasal drip and sore throat. Negative for ear pain.   Respiratory: Negative for cough.   Cardiovascular: Negative for chest  pain and palpitations.  Gastrointestinal: Negative for nausea and vomiting.      Objective:    BP 130/76   Pulse 92   Temp 98 F (36.7 C) (Oral)   Ht 5\' 4"  (1.626 m)   Wt 129 lb 3.2 oz (58.6 kg)   LMP 11/20/2012   SpO2 95%   BMI 22.18 kg/m    Physical Exam  Constitutional: She appears well-developed and well-nourished.  HENT:  Head: Normocephalic and atraumatic.  Right Ear: Hearing, tympanic membrane, external ear and ear canal normal. No drainage, swelling or tenderness. No foreign bodies. Tympanic membrane is not erythematous and not bulging. No middle ear effusion. No decreased hearing is noted.  Left Ear: Hearing, tympanic membrane, external ear and ear canal normal. No drainage, swelling or tenderness. No foreign bodies. Tympanic membrane is not erythematous and not bulging.  No middle ear effusion. No decreased hearing is noted.  Nose: Nose normal. No rhinorrhea. Right sinus exhibits no maxillary sinus tenderness and no frontal sinus tenderness. Left sinus exhibits no maxillary sinus tenderness and no frontal sinus tenderness.  Mouth/Throat: Uvula is midline and mucous membranes are normal. Oral lesions present. Posterior oropharyngeal erythema present. No oropharyngeal exudate, posterior oropharyngeal edema or tonsillar abscesses.  5-6 discrete 1 cm lesions roof  of mouth erythematous with gray base. No discharge. No vesicles seen.  Eyes: Conjunctivae are normal.  Cardiovascular: Normal rate, regular rhythm, normal heart sounds and normal pulses.   Pulmonary/Chest: Effort normal and breath sounds normal. She has no wheezes. She has no rhonchi. She has no rales. Right breast exhibits no inverted nipple, no mass, no nipple discharge, no skin change and no tenderness. Left breast exhibits no inverted nipple, no mass, no nipple discharge, no skin change and no tenderness. Breasts are symmetrical.  CBE performed. No masses, rash appreciated during my exam.   Lymphadenopathy:        Head (right side): No submental, no submandibular, no tonsillar, no preauricular, no posterior auricular and no occipital adenopathy present.       Head (left side): No submental, no submandibular, no tonsillar, no preauricular, no posterior auricular and no occipital adenopathy present.    She has no cervical adenopathy.  Neurological: She is alert.  Skin: Skin is warm and dry.  Psychiatric: She has a normal mood and affect. Her speech is normal and behavior is normal. Thought content normal.  Vitals reviewed.      Assessment & Plan:   1. Strep pharyngitis Rapid strep positive. Suspects oral lesions are related to strep pharyngitis or self limiting aphthous stomatitis. Treated with amoxicillin. Return precautions given.  - POCT Rapid Strep A - amoxicillin (AMOXIL) 500 MG tablet; Take two tablets ( total 1000 mg) PO q24h for 10 days  Dispense: 20 tablet; Refill: 0  2. Breast tenderness No asymmetry or masses appreciated during clinical breast exam. Patient and I jointly decided that the "tingling" the patient is experiencing, we will continue to watch over the next couple weeks, and if symptom persists, we will order imaging at that time. We're both very reassured the patient had a normal mammogram this year.    I have discontinued Ms. Hakimian's cyanocobalamin, cholecalciferol, fexofenadine, pseudoephedrine, and omeprazole. I am also having her start on amoxicillin. Additionally, I am having her maintain her magic mouthwash and PAROEX.   Meds ordered this encounter  Medications  . amoxicillin (AMOXIL) 500 MG tablet    Sig: Take two tablets ( total 1000 mg) PO q24h for 10 days    Dispense:  20 tablet    Refill:  0    Order Specific Question:   Supervising Provider    Answer:   [name] [[id number]]    Return precautions given.   Risks, benefits, and alternatives of the medications and treatment plan prescribed today were discussed, and patient expressed understanding.    Education regarding symptom management and diagnosis given to patient on AVS.  Continue to follow with TULLO, Aris Everts, MD for routine health maintenance.   Derek Jack and I agreed with plan.   Mable Paris, FNP

## 2016-01-09 DIAGNOSIS — R42 Dizziness and giddiness: Secondary | ICD-10-CM | POA: Diagnosis not present

## 2016-03-15 DIAGNOSIS — H5213 Myopia, bilateral: Secondary | ICD-10-CM | POA: Diagnosis not present

## 2016-08-08 ENCOUNTER — Telehealth: Payer: Self-pay | Admitting: Internal Medicine

## 2016-08-08 DIAGNOSIS — E785 Hyperlipidemia, unspecified: Secondary | ICD-10-CM

## 2016-08-08 DIAGNOSIS — Z1239 Encounter for other screening for malignant neoplasm of breast: Secondary | ICD-10-CM

## 2016-08-08 DIAGNOSIS — R5383 Other fatigue: Secondary | ICD-10-CM

## 2016-08-08 NOTE — Telephone Encounter (Signed)
Pt made an appointment for 09/27/16 to have her yearly physical. She would like to have her labs done before she comes in to see Dr. Derrel Nip. Can lab orders be put in so pt can be scheduled for labs, please.

## 2016-08-10 NOTE — Telephone Encounter (Signed)
LMTCB. Labs have ordered. Need to schedule fasting lab appt.

## 2016-08-15 NOTE — Telephone Encounter (Signed)
Spoke with pt and she stated that she has already called back and scheduled her lab appt. While on the phone with the pt she stated that she is due for a mammogram. Put the order in and pt stated that she would call tomorrow to schedule the appt.

## 2016-09-07 ENCOUNTER — Ambulatory Visit
Admission: RE | Admit: 2016-09-07 | Discharge: 2016-09-07 | Disposition: A | Discharge status: Home / Self Care | Payer: 59 | Source: Ambulatory Visit | Attending: Internal Medicine | Admitting: Internal Medicine

## 2016-09-07 DIAGNOSIS — Z1239 Encounter for other screening for malignant neoplasm of breast: Secondary | ICD-10-CM

## 2016-09-07 DIAGNOSIS — Z1231 Encounter for screening mammogram for malignant neoplasm of breast: Secondary | ICD-10-CM | POA: Diagnosis not present

## 2016-09-24 ENCOUNTER — Other Ambulatory Visit (INDEPENDENT_AMBULATORY_CARE_PROVIDER_SITE_OTHER): Payer: 59

## 2016-09-24 DIAGNOSIS — Z0000 Encounter for general adult medical examination without abnormal findings: Secondary | ICD-10-CM | POA: Diagnosis not present

## 2016-09-24 DIAGNOSIS — E559 Vitamin D deficiency, unspecified: Secondary | ICD-10-CM | POA: Diagnosis not present

## 2016-09-24 DIAGNOSIS — E785 Hyperlipidemia, unspecified: Secondary | ICD-10-CM

## 2016-09-24 DIAGNOSIS — R5383 Other fatigue: Secondary | ICD-10-CM | POA: Diagnosis not present

## 2016-09-24 LAB — LIPID PANEL
Cholesterol: 169 mg/dL (ref 0–200)
HDL: 78.7 mg/dL (ref >39.00)
LDL Cholesterol: 76 mg/dL (ref 0–99)
NONHDL: 90.46
Total CHOL/HDL Ratio: 2
Triglycerides: 70 mg/dL (ref 0.0–149.0)
VLDL: 14 mg/dL (ref 0.0–40.0)

## 2016-09-24 LAB — CBC WITH DIFFERENTIAL/PLATELET
BASOS PCT: 0.8 % (ref 0.0–3.0)
Basophils Absolute: 0 10*3/uL (ref 0.0–0.1)
EOS ABS: 0.1 10*3/uL (ref 0.0–0.7)
Eosinophils Relative: 1.4 % (ref 0.0–5.0)
HEMATOCRIT: 39.7 % (ref 36.0–46.0)
HEMOGLOBIN: 13.3 g/dL (ref 12.0–15.0)
LYMPHS PCT: 29.2 % (ref 12.0–46.0)
Lymphs Abs: 1.4 10*3/uL (ref 0.7–4.0)
MCHC: 33.5 g/dL (ref 30.0–36.0)
MCV: 86.3 fl (ref 78.0–100.0)
Monocytes Absolute: 0.5 10*3/uL (ref 0.1–1.0)
Monocytes Relative: 10.5 % (ref 3.0–12.0)
Neutro Abs: 2.8 10*3/uL (ref 1.4–7.7)
Neutrophils Relative %: 58.1 % (ref 43.0–77.0)
Platelets: 217 10*3/uL (ref 150.0–400.0)
RBC: 4.6 Mil/uL (ref 3.87–5.11)
RDW: 13.8 % (ref 11.5–15.5)
WBC: 4.8 10*3/uL (ref 4.0–10.5)

## 2016-09-24 LAB — COMPREHENSIVE METABOLIC PANEL
ALK PHOS: 61 U/L (ref 39–117)
ALT: 12 U/L (ref 0–35)
AST: 16 U/L (ref 0–37)
Albumin: 4 g/dL (ref 3.5–5.2)
BUN: 20 mg/dL (ref 6–23)
CO2: 30 mEq/L (ref 19–32)
Calcium: 9.4 mg/dL (ref 8.4–10.5)
Chloride: 104 mEq/L (ref 96–112)
Creatinine, Ser: 0.7 mg/dL (ref 0.40–1.20)
GFR: 90.86 mL/min (ref >60.00)
GLUCOSE: 96 mg/dL (ref 70–99)
Potassium: 4.1 mEq/L (ref 3.5–5.1)
SODIUM: 139 meq/L (ref 135–145)
TOTAL PROTEIN: 7.4 g/dL (ref 6.0–8.3)
Total Bilirubin: 0.4 mg/dL (ref 0.2–1.2)

## 2016-09-24 LAB — LDL CHOLESTEROL, DIRECT: LDL DIRECT: 77 mg/dL

## 2016-09-24 LAB — VITAMIN D 25 HYDROXY (VIT D DEFICIENCY, FRACTURES): VITD: 66.09 ng/mL (ref 30.00–100.00)

## 2016-09-24 LAB — TSH: TSH: 1.9 u[IU]/mL (ref 0.35–4.50)

## 2016-09-25 ENCOUNTER — Encounter: Payer: Self-pay | Admitting: Internal Medicine

## 2016-09-27 ENCOUNTER — Encounter: Payer: 59 | Admitting: Internal Medicine

## 2016-11-09 ENCOUNTER — Encounter: Payer: Self-pay | Admitting: Internal Medicine

## 2016-11-09 ENCOUNTER — Ambulatory Visit (INDEPENDENT_AMBULATORY_CARE_PROVIDER_SITE_OTHER): Payer: 59 | Admitting: Internal Medicine

## 2016-11-09 DIAGNOSIS — Z0001 Encounter for general adult medical examination with abnormal findings: Secondary | ICD-10-CM | POA: Diagnosis not present

## 2016-11-09 DIAGNOSIS — M85859 Other specified disorders of bone density and structure, unspecified thigh: Secondary | ICD-10-CM | POA: Diagnosis not present

## 2016-11-09 DIAGNOSIS — K219 Gastro-esophageal reflux disease without esophagitis: Secondary | ICD-10-CM | POA: Diagnosis not present

## 2016-11-09 DIAGNOSIS — Z0000 Encounter for general adult medical examination without abnormal findings: Secondary | ICD-10-CM

## 2016-11-09 DIAGNOSIS — E559 Vitamin D deficiency, unspecified: Secondary | ICD-10-CM

## 2016-11-09 NOTE — Progress Notes (Addendum)
Patient ID: [name] [name], female    DOB: [birth year]  Age: 61 y.o. MRN: [id number]  The patient is here for annual preventive examination and management of other chronic and acute problems.  Sister was diagnosed with uterine cancer at the age of 56 PAP was normal March 2016 Other sister has lymphoma, 3rd died of a SDH  After a fall  colonoscopy was normal 2013 Mammogram normal June 2018      The risk factors are reflected in the social history.  The roster of all physicians providing medical care to patient - is listed in the Snapshot section of the chart.  Activities of daily living:  The patient is 100% independent in all ADLs: dressing, toileting, feeding as well as independent mobility  Home safety : The patient has smoke detectors in the home. They wear seatbelts.  There are no firearms at home. There is no violence in the home.   There is no risks for hepatitis, STDs or HIV. There is no   history of blood transfusion. They have no travel history to infectious disease endemic areas of the world.  The patient has seen their dentist in the last six month. They have seen their eye doctor in the last year.    Discussed the need for sun protection: hats, long sleeves and use of sunscreen if there is significant sun exposure.   Diet: the importance of a healthy diet is discussed. They do have a healthy diet.  The benefits of regular aerobic exercise were discussed. She walks 4 times per week ,  20 minutes.   Depression screen: there are no signs or vegative symptoms of depression- irritability, change in appetite, anhedonia, sadness/tearfullness.  The following portions of the patient's history were reviewed and updated as appropriate: allergies, current medications, past family history, past medical history,  past surgical history, past social history  and problem list.  Visual acuity was not assessed per patient preference since she has regular follow up with her ophthalmologist.  Hearing and body mass index were assessed and reviewed.   During the course of the visit the patient was educated and counseled about appropriate screening and preventive services including : fall prevention , diabetes screening, nutrition counseling, colorectal cancer screening, and recommended immunizations.    CC: Diagnoses of Encounter for preventive health examination, Gastroesophageal reflux disease without esophagitis, Osteopenia of hip, unspecified laterality, and Vitamin D deficiency were pertinent to this visit.  Recurrent anxiety,  Thinks her GERD may trigger it because she stopped taking prilosec   GERD:  Using Doterra oils,  Sleeping on an incline.  Coughs after eating berries. doesn't drink enough water.  Has recurrent episodes of mild light headedness,  In the morning when she first gets up    Thinking about trying a Doterra bone nutrient multivitamin   [name] has a past medical history of Allergy; Anxiety; and Hyperlipidemia.   She has no past surgical history on file.   Her family history includes COPD in her mother and sister; Cancer in her mother; Cancer (age of onset: 52) in her sister; Diabetes in her brother and mother; Early death in her father; Heart disease in her mother; Heart disease (age of onset: 27) in her father; Hyperlipidemia in her father and mother; Stroke in her maternal grandmother and sister.She reports that she has never smoked. She has never used smokeless tobacco. She reports that she does not drink alcohol or use drugs.  Outpatient Medications Prior to Visit  Medication Sig  Dispense Refill  . PAROEX 0.12 % solution   1  . amoxicillin (AMOXIL) 500 MG tablet Take two tablets ( total 1000 mg) PO q24h for 10 days (Patient not taking: Reported on 11/09/2016) 20 tablet 0  . magic mouthwash SOLN   1   No facility-administered medications prior to visit.     Review of Systems   Patient denies headache, fevers, malaise, unintentional weight loss,  skin rash, eye pain, sinus congestion and sinus pain, sore throat, dysphagia,  hemoptysis , cough, dyspnea, wheezing, chest pain, palpitations, orthopnea, edema, abdominal pain, nausea, melena, diarrhea, constipation, flank pain, dysuria, hematuria, urinary  Frequency, nocturia, numbness, tingling, seizures,  Focal weakness, Loss of consciousness,  Tremor, insomnia, depression, anxiety, and suicidal ideation.     Objective:  BP 120/82 (BP Location: Left Arm, Patient Position: Sitting, Cuff Size: Normal)   Pulse 76   Temp 98.1 F (36.7 C) (Oral)   Resp 17   Ht 5\' 4"  (1.626 m)   Wt 127 lb 12.8 oz (58 kg)   LMP 11/20/2012   SpO2 98%   BMI 21.94 kg/m   Physical Exam   General appearance: alert, cooperative and appears stated age Head: Normocephalic, without obvious abnormality, atraumatic Eyes: conjunctivae/corneas clear. PERRL, EOM's intact. Fundi benign. Ears: normal TM's and external ear canals both ears Nose: Nares normal. Septum midline. Mucosa normal. No drainage or sinus tenderness. Throat: lips, mucosa, and tongue normal; teeth and gums normal Neck: no adenopathy, no carotid bruit, no JVD, supple, symmetrical, trachea midline and thyroid not enlarged, symmetric, no tenderness/mass/nodules Lungs: clear to auscultation bilaterally Breasts: normal appearance, no masses or tenderness Heart: regular rate and rhythm, S1, S2 normal, no murmur, click, rub or gallop Abdomen: soft, non-tender; bowel sounds normal; no masses,  no organomegaly Extremities: extremities normal, atraumatic, no cyanosis or edema Pulses: 2+ and symmetric Skin: Skin color, texture, turgor normal. No rashes or lesions Neurologic: Alert and oriented X 3, normal strength and tone. Normal symmetric reflexes. Normal coordination and gait.      Assessment & Plan:   Problem List Items Addressed This Visit    Encounter for preventive health examination    Annual comprehensive preventive exam was done as well as an  evaluation and management of chronic conditions .  During the course of the visit the patient was educated and counseled about appropriate screening and preventive services including :   Screening for thyroid disease, diabetes, liver and kidney disease, lipid analysis with projected  10 year  risk for CAD , nutrition counseling, breast, cervical and colorectal cancer screening, and recommended immunizations.  Printed recommendations for health maintenance screenings was given.      GERD (gastroesophageal reflux disease)    Discussed current controversy regarding prolonged use of PPI in patients without documented Barretts esophagus.  Patient has no prior EGD but has been on PPI therapy for > 5 years (per patient).  Suggested trial of pepcid 20 mg daily.  If GERD symptoms return,  advised her to accept referral for EGD.        Osteopenia    She has moderate osteopenia by recent DEXA , , T Score was  -2.1  10 yr risk is 6% .  Recommend weight bearing exercise, working on upper body strength using low weights, 1200 mg calcium through diet /supplements and 1000 units Vit D daily  Repeat DEXA in 2 years        Vitamin D deficiency    Repeat testing is needed to guide  supplementation of Vitamin D         I have discontinued Ms. Stfleur's magic mouthwash and amoxicillin. I am also having her maintain her PAROEX, Vitamin D3, and vitamin B-12.  Meds ordered this encounter  Medications  . Cholecalciferol (VITAMIN D3) 1000 units CAPS    Sig: Take 1 capsule by mouth daily.  . vitamin B-12 (CYANOCOBALAMIN) 1000 MCG tablet    Sig: Take 1,000 mcg by mouth daily.    Medications Discontinued During This Encounter  Medication Reason  . amoxicillin (AMOXIL) 500 MG tablet Patient has not taken in last 30 days  . magic mouthwash SOLN Patient has not taken in last 30 days    Follow-up: No Follow-up on file.   [name], MD

## 2016-11-09 NOTE — Patient Instructions (Addendum)
I encourage you to drink more water or decaffeinated tea.  You need a minium of 64 ounces of water  daily.  .   I appreciate your concern about continuing your PPI in light of the recently published studies suggesting an association with increased risk of dementia, osteoporosis and kidney failure.  I advise you to try taking either famotidine 20 mg once or twice daily,  or to  ranitidine 150 mg once or twice daily.  These medications are  H2 blockers and are available without a prescriptions.   if your reflux symptoms are controlled,  You can Continue the daily h2 blocker.   I recommend getting the majority of your calcium and Vitamin D  through diet rather than supplements given the recent association of calcium   I encourage you to build your upper body strength and tone using low weights ( 3 or 5 lbs)    Health Maintenance for Postmenopausal Women Menopause is a normal process in which your reproductive ability comes to an end. This process happens gradually over a span of months to years, usually between the ages of 73 and 84. Menopause is complete when you have missed 12 consecutive menstrual periods. It is important to talk with your health care provider about some of the most common conditions that affect postmenopausal women, such as heart disease, cancer, and bone loss (osteoporosis). Adopting a healthy lifestyle and getting preventive care can help to promote your health and wellness. Those actions can also lower your chances of developing some of these common conditions. What should I know about menopause? During menopause, you may experience a number of symptoms, such as:  Moderate-to-severe hot flashes.  Night sweats.  Decrease in sex drive.  Mood swings.  Headaches.  Tiredness.  Irritability.  Memory problems.  Insomnia.  Choosing to treat or not to treat menopausal changes is an individual decision that you make with your health care provider. What should I know  about hormone replacement therapy and supplements? Hormone therapy products are effective for treating symptoms that are associated with menopause, such as hot flashes and night sweats. Hormone replacement carries certain risks, especially as you become older. If you are thinking about using estrogen or estrogen with progestin treatments, discuss the benefits and risks with your health care provider. What should I know about heart disease and stroke? Heart disease, heart attack, and stroke become more likely as you age. This may be due, in part, to the hormonal changes that your body experiences during menopause. These can affect how your body processes dietary fats, triglycerides, and cholesterol. Heart attack and stroke are both medical emergencies. There are many things that you can do to help prevent heart disease and stroke:  Have your blood pressure checked at least every 1-2 years. High blood pressure causes heart disease and increases the risk of stroke.  If you are 66-65 years old, ask your health care provider if you should take aspirin to prevent a heart attack or a stroke.  Do not use any tobacco products, including cigarettes, chewing tobacco, or electronic cigarettes. If you need help quitting, ask your health care provider.  It is important to eat a healthy diet and maintain a healthy weight. ? Be sure to include plenty of vegetables, fruits, low-fat dairy products, and lean protein. ? Avoid eating foods that are high in solid fats, added sugars, or salt (sodium).  Get regular exercise. This is one of the most important things that you can do for your  health. ? Try to exercise for at least 150 minutes each week. The type of exercise that you do should increase your heart rate and make you sweat. This is known as moderate-intensity exercise. ? Try to do strengthening exercises at least twice each week. Do these in addition to the moderate-intensity exercise.  Know your numbers.Ask  your health care provider to check your cholesterol and your blood glucose. Continue to have your blood tested as directed by your health care provider.  What should I know about cancer screening? There are several types of cancer. Take the following steps to reduce your risk and to catch any cancer development as early as possible. Breast Cancer  Practice breast self-awareness. ? This means understanding how your breasts normally appear and feel. ? It also means doing regular breast self-exams. Let your health care provider know about any changes, no matter how small.  If you are 65 or older, have a clinician do a breast exam (clinical breast exam or CBE) every year. Depending on your age, family history, and medical history, it may be recommended that you also have a yearly breast X-ray (mammogram).  If you have a family history of breast cancer, talk with your health care provider about genetic screening.  If you are at high risk for breast cancer, talk with your health care provider about having an MRI and a mammogram every year.  Breast cancer (BRCA) gene test is recommended for women who have family members with BRCA-related cancers. Results of the assessment will determine the need for genetic counseling and BRCA1 and for BRCA2 testing. BRCA-related cancers include these types: ? Breast. This occurs in males or females. ? Ovarian. ? Tubal. This may also be called fallopian tube cancer. ? Cancer of the abdominal or pelvic lining (peritoneal cancer). ? Prostate. ? Pancreatic.  Cervical, Uterine, and Ovarian Cancer Your health care provider may recommend that you be screened regularly for cancer of the pelvic organs. These include your ovaries, uterus, and vagina. This screening involves a pelvic exam, which includes checking for microscopic changes to the surface of your cervix (Pap test).  For women ages 21-65, health care providers may recommend a pelvic exam and a Pap test every  three years. For women ages 31-65, they may recommend the Pap test and pelvic exam, combined with testing for human papilloma virus (HPV), every five years. Some types of HPV increase your risk of cervical cancer. Testing for HPV may also be done on women of any age who have unclear Pap test results.  Other health care providers may not recommend any screening for nonpregnant women who are considered low risk for pelvic cancer and have no symptoms. Ask your health care provider if a screening pelvic exam is right for you.  If you have had past treatment for cervical cancer or a condition that could lead to cancer, you need Pap tests and screening for cancer for at least 20 years after your treatment. If Pap tests have been discontinued for you, your risk factors (such as having a new sexual partner) need to be reassessed to determine if you should start having screenings again. Some women have medical problems that increase the chance of getting cervical cancer. In these cases, your health care provider may recommend that you have screening and Pap tests more often.  If you have a family history of uterine cancer or ovarian cancer, talk with your health care provider about genetic screening.  If you have vaginal bleeding after  reaching menopause, tell your health care provider.  There are currently no reliable tests available to screen for ovarian cancer.  Lung Cancer Lung cancer screening is recommended for adults 39-62 years old who are at high risk for lung cancer because of a history of smoking. A yearly low-dose CT scan of the lungs is recommended if you:  Currently smoke.  Have a history of at least 30 pack-years of smoking and you currently smoke or have quit within the past 15 years. A pack-year is smoking an average of one pack of cigarettes per day for one year.  Yearly screening should:  Continue until it has been 15 years since you quit.  Stop if you develop a health problem that  would prevent you from having lung cancer treatment.  Colorectal Cancer  This type of cancer can be detected and can often be prevented.  Routine colorectal cancer screening usually begins at age 26 and continues through age 52.  If you have risk factors for colon cancer, your health care provider may recommend that you be screened at an earlier age.  If you have a family history of colorectal cancer, talk with your health care provider about genetic screening.  Your health care provider may also recommend using home test kits to check for hidden blood in your stool.  A small camera at the end of a tube can be used to examine your colon directly (sigmoidoscopy or colonoscopy). This is done to check for the earliest forms of colorectal cancer.  Direct examination of the colon should be repeated every 5-10 years until age 44. However, if early forms of precancerous polyps or small growths are found or if you have a family history or genetic risk for colorectal cancer, you may need to be screened more often.  Skin Cancer  Check your skin from head to toe regularly.  Monitor any moles. Be sure to tell your health care provider: ? About any new moles or changes in moles, especially if there is a change in a mole's shape or color. ? If you have a mole that is larger than the size of a pencil eraser.  If any of your family members has a history of skin cancer, especially at a young age, talk with your health care provider about genetic screening.  Always use sunscreen. Apply sunscreen liberally and repeatedly throughout the day.  Whenever you are outside, protect yourself by wearing long sleeves, pants, a wide-brimmed hat, and sunglasses.  What should I know about osteoporosis? Osteoporosis is a condition in which bone destruction happens more quickly than new bone creation. After menopause, you may be at an increased risk for osteoporosis. To help prevent osteoporosis or the bone fractures  that can happen because of osteoporosis, the following is recommended:  If you are 66-33 years old, get at least 1,000 mg of calcium and at least 600 mg of vitamin D per day.  If you are older than age 53 but younger than age 10, get at least 1,200 mg of calcium and at least 600 mg of vitamin D per day.  If you are older than age 60, get at least 1,200 mg of calcium and at least 800 mg of vitamin D per day.  Smoking and excessive alcohol intake increase the risk of osteoporosis. Eat foods that are rich in calcium and vitamin D, and do weight-bearing exercises several times each week as directed by your health care provider. What should I know about how menopause affects  my mental health? Depression may occur at any age, but it is more common as you become older. Common symptoms of depression include:  Low or sad mood.  Changes in sleep patterns.  Changes in appetite or eating patterns.  Feeling an overall lack of motivation or enjoyment of activities that you previously enjoyed.  Frequent crying spells.  Talk with your health care provider if you think that you are experiencing depression. What should I know about immunizations? It is important that you get and maintain your immunizations. These include:  Tetanus, diphtheria, and pertussis (Tdap) booster vaccine.  Influenza every year before the flu season begins.  Pneumonia vaccine.  Shingles vaccine.  Your health care provider may also recommend other immunizations. This information is not intended to replace advice given to you by your health care provider. Make sure you discuss any questions you have with your health care provider. Document Released: 04/27/2005 Document Revised: 09/23/2015 Document Reviewed: 12/07/2014 Elsevier Interactive Patient Education  2018 [employer].

## 2016-11-11 NOTE — Assessment & Plan Note (Addendum)
Annual comprehensive preventive exam was done as well as an evaluation and management of chronic conditions .  During the course of the visit the patient was educated and counseled about appropriate screening and preventive services including :   Screening for thyroid disease, diabetes, liver and kidney disease, lipid analysis with projected  10 year  risk for CAD , nutrition counseling, breast, cervical and colorectal cancer screening, and recommended immunizations.  Printed recommendations for health maintenance screenings was given.

## 2016-11-11 NOTE — Assessment & Plan Note (Signed)
She has moderate osteopenia by recent DEXA , , T Score was  -2.1  10 yr risk is 6% .  Recommend weight bearing exercise, working on upper body strength using low weights, 1200 mg calcium through diet /supplements and 1000 units Vit D daily  Repeat DEXA in 2 years

## 2016-11-11 NOTE — Assessment & Plan Note (Signed)
Discussed current controversy regarding prolonged use of PPI in patients without documented Barretts esophagus.  Patient has no prior EGD but has been on PPI therapy for > 5 years (per patient).  Suggested trial of pepcid 20 mg daily.  If GERD symptoms return,  advised [name] [name] to accept referral for EGD.  

## 2017-01-16 DIAGNOSIS — E559 Vitamin D deficiency, unspecified: Secondary | ICD-10-CM | POA: Insufficient documentation

## 2017-01-16 NOTE — Assessment & Plan Note (Signed)
Repeat testing is needed to guide supplementation of Vitamin D

## 2017-01-17 ENCOUNTER — Encounter: Payer: Self-pay | Admitting: Internal Medicine

## 2017-03-07 DIAGNOSIS — H5201 Hypermetropia, right eye: Secondary | ICD-10-CM | POA: Diagnosis not present

## 2017-03-13 DIAGNOSIS — H109 Unspecified conjunctivitis: Secondary | ICD-10-CM | POA: Diagnosis not present

## 2017-08-23 ENCOUNTER — Encounter: Payer: Self-pay | Admitting: Internal Medicine

## 2017-08-23 ENCOUNTER — Other Ambulatory Visit (HOSPITAL_COMMUNITY)
Admission: RE | Admit: 2017-08-23 | Discharge: 2017-08-23 | Disposition: A | Discharge status: Home / Self Care | Payer: 59 | Source: Ambulatory Visit | Attending: Internal Medicine | Admitting: Internal Medicine

## 2017-08-23 ENCOUNTER — Ambulatory Visit (INDEPENDENT_AMBULATORY_CARE_PROVIDER_SITE_OTHER): Payer: 59 | Admitting: Internal Medicine

## 2017-08-23 VITALS — BP 106/62 | HR 67 | Temp 97.9°F | Resp 15 | Ht 64.0 in | Wt 125.0 lb

## 2017-08-23 DIAGNOSIS — Z1231 Encounter for screening mammogram for malignant neoplasm of breast: Secondary | ICD-10-CM | POA: Diagnosis not present

## 2017-08-23 DIAGNOSIS — Z124 Encounter for screening for malignant neoplasm of cervix: Secondary | ICD-10-CM | POA: Insufficient documentation

## 2017-08-23 DIAGNOSIS — Z23 Encounter for immunization: Secondary | ICD-10-CM

## 2017-08-23 DIAGNOSIS — Z114 Encounter for screening for human immunodeficiency virus [HIV]: Secondary | ICD-10-CM | POA: Diagnosis not present

## 2017-08-23 DIAGNOSIS — R5383 Other fatigue: Secondary | ICD-10-CM

## 2017-08-23 DIAGNOSIS — K2100 Gastro-esophageal reflux disease with esophagitis, without bleeding: Secondary | ICD-10-CM

## 2017-08-23 DIAGNOSIS — Z0001 Encounter for general adult medical examination with abnormal findings: Secondary | ICD-10-CM | POA: Diagnosis not present

## 2017-08-23 DIAGNOSIS — E782 Mixed hyperlipidemia: Secondary | ICD-10-CM | POA: Diagnosis not present

## 2017-08-23 DIAGNOSIS — Z1239 Encounter for other screening for malignant neoplasm of breast: Secondary | ICD-10-CM

## 2017-08-23 DIAGNOSIS — E559 Vitamin D deficiency, unspecified: Secondary | ICD-10-CM

## 2017-08-23 LAB — COMPREHENSIVE METABOLIC PANEL
ALT: 12 U/L (ref 0–35)
AST: 15 U/L (ref 0–37)
Albumin: 3.9 g/dL (ref 3.5–5.2)
Alkaline Phosphatase: 60 U/L (ref 39–117)
BUN: 15 mg/dL (ref 6–23)
CALCIUM: 9.3 mg/dL (ref 8.4–10.5)
CHLORIDE: 104 meq/L (ref 96–112)
CO2: 30 mEq/L (ref 19–32)
Creatinine, Ser: 0.62 mg/dL (ref 0.40–1.20)
GFR: 104.19 mL/min (ref >60.00)
Glucose, Bld: 92 mg/dL (ref 70–99)
POTASSIUM: 4.2 meq/L (ref 3.5–5.1)
Sodium: 140 mEq/L (ref 135–145)
Total Bilirubin: 0.4 mg/dL (ref 0.2–1.2)
Total Protein: 7.2 g/dL (ref 6.0–8.3)

## 2017-08-23 LAB — LIPID PANEL
Cholesterol: 158 mg/dL (ref 0–200)
HDL: 64.2 mg/dL (ref >39.00)
LDL Cholesterol: 82 mg/dL (ref 0–99)
NonHDL: 94.22
Total CHOL/HDL Ratio: 2
Triglycerides: 61 mg/dL (ref 0.0–149.0)
VLDL: 12.2 mg/dL (ref 0.0–40.0)

## 2017-08-23 LAB — TSH: TSH: 0.92 u[IU]/mL (ref 0.35–4.50)

## 2017-08-23 LAB — VITAMIN D 25 HYDROXY (VIT D DEFICIENCY, FRACTURES): VITD: 46.62 ng/mL (ref 30.00–100.00)

## 2017-08-23 MED ORDER — ZOSTER VAC RECOMB ADJUVANTED 50 MCG/0.5ML IM SUSR: 0.5000 mL | Freq: Once | INTRAMUSCULAR | 1 refills | Status: AC
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
## 2017-08-23 NOTE — Patient Instructions (Addendum)
I appreciate your concern about continuing your omeprazole 40 mg PPI_  in light of the recently published studies suggesting an association with increased risk of dementia, osteoporosis  and kidney failure, but your current symptoms are concerning for a stricture..  I am referring you to [place] for an endoscopy   The ShingRx vaccine is now available in local pharmacies and is much more protective thant Zostavaxs,  It is therefore ADVISED for all interested adults over 50 to prevent shingles    Please check with [employer] about your last TDap vaccination   Your persistent cough may be due to "Post nasal drip"  Try adding benadryl (dipenhydramine, 12.5 to 25 mg) taken at bedtime .  You can continue daytime  Zyrtec for allergies if it helps your sneezing  If you want to avoid a daily anthistamine,:  You should try [employer]'s Sinus rinse ;  It is a stong sinus "flush" using water and medicated salts.  Do it over the sink because it can be a bit messy    Health Maintenance for Postmenopausal Women Menopause is a normal process in which your reproductive ability comes to an end. This process happens gradually over a span of months to years, usually between the ages of 64 and 44. Menopause is complete when you have missed 12 consecutive menstrual periods. It is important to talk with your health care provider about some of the most common conditions that affect postmenopausal women, such as heart disease, cancer, and bone loss (osteoporosis). Adopting a healthy lifestyle and getting preventive care can help to promote your health and wellness. Those actions can also lower your chances of developing some of these common conditions. What should I know about menopause? During menopause, you may experience a number of symptoms, such as:  Moderate-to-severe hot flashes.  Night sweats.  Decrease in sex drive.  Mood swings.  Headaches.  Tiredness.  Irritability.  Memory  problems.  Insomnia.  Choosing to treat or not to treat menopausal changes is an individual decision that you make with your health care provider. What should I know about hormone replacement therapy and supplements? Hormone therapy products are effective for treating symptoms that are associated with menopause, such as hot flashes and night sweats. Hormone replacement carries certain risks, especially as you become older. If you are thinking about using estrogen or estrogen with progestin treatments, discuss the benefits and risks with your health care provider. What should I know about heart disease and stroke? Heart disease, heart attack, and stroke become more likely as you age. This may be due, in part, to the hormonal changes that your body experiences during menopause. These can affect how your body processes dietary fats, triglycerides, and cholesterol. Heart attack and stroke are both medical emergencies. There are many things that you can do to help prevent heart disease and stroke:  Have your blood pressure checked at least every 1-2 years. High blood pressure causes heart disease and increases the risk of stroke.  If you are 20-48 years old, ask your health care provider if you should take aspirin to prevent a heart attack or a stroke.  Do not use any tobacco products, including cigarettes, chewing tobacco, or electronic cigarettes. If you need help quitting, ask your health care provider.  It is important to eat a healthy diet and maintain a healthy weight. ? Be sure to include plenty of vegetables, fruits, low-fat dairy products, and lean protein. ? Avoid eating foods that are high in  solid fats, added sugars, or salt (sodium).  Get regular exercise. This is one of the most important things that you can do for your health. ? Try to exercise for at least 150 minutes each week. The type of exercise that you do should increase your heart rate and make you sweat. This is known as  moderate-intensity exercise. ? Try to do strengthening exercises at least twice each week. Do these in addition to the moderate-intensity exercise.  Know your numbers.Ask your health care provider to check your cholesterol and your blood glucose. Continue to have your blood tested as directed by your health care provider.  What should I know about cancer screening? There are several types of cancer. Take the following steps to reduce your risk and to catch any cancer development as early as possible. Breast Cancer  Practice breast self-awareness. ? This means understanding how your breasts normally appear and feel. ? It also means doing regular breast self-exams. Let your health care provider know about any changes, no matter how small.  If you are 72 or older, have a clinician do a breast exam (clinical breast exam or CBE) every year. Depending on your age, family history, and medical history, it may be recommended that you also have a yearly breast X-ray (mammogram).  If you have a family history of breast cancer, talk with your health care provider about genetic screening.  If you are at high risk for breast cancer, talk with your health care provider about having an MRI and a mammogram every year.  Breast cancer (BRCA) gene test is recommended for women who have family members with BRCA-related cancers. Results of the assessment will determine the need for genetic counseling and BRCA1 and for BRCA2 testing. BRCA-related cancers include these types: ? Breast. This occurs in males or females. ? Ovarian. ? Tubal. This may also be called fallopian tube cancer. ? Cancer of the abdominal or pelvic lining (peritoneal cancer). ? Prostate. ? Pancreatic.  Cervical, Uterine, and Ovarian Cancer Your health care provider may recommend that you be screened regularly for cancer of the pelvic organs. These include your ovaries, uterus, and vagina. This screening involves a pelvic exam, which  includes checking for microscopic changes to the surface of your cervix (Pap test).  For women ages 21-65, health care providers may recommend a pelvic exam and a Pap test every three years. For women ages 32-65, they may recommend the Pap test and pelvic exam, combined with testing for human papilloma virus (HPV), every five years. Some types of HPV increase your risk of cervical cancer. Testing for HPV may also be done on women of any age who have unclear Pap test results.  Other health care providers may not recommend any screening for nonpregnant women who are considered low risk for pelvic cancer and have no symptoms. Ask your health care provider if a screening pelvic exam is right for you.  If you have had past treatment for cervical cancer or a condition that could lead to cancer, you need Pap tests and screening for cancer for at least 20 years after your treatment. If Pap tests have been discontinued for you, your risk factors (such as having a new sexual partner) need to be reassessed to determine if you should start having screenings again. Some women have medical problems that increase the chance of getting cervical cancer. In these cases, your health care provider may recommend that you have screening and Pap tests more often.  If you have  a family history of uterine cancer or ovarian cancer, talk with your health care provider about genetic screening.  If you have vaginal bleeding after reaching menopause, tell your health care provider.  There are currently no reliable tests available to screen for ovarian cancer.  Lung Cancer Lung cancer screening is recommended for adults 48-55 years old who are at high risk for lung cancer because of a history of smoking. A yearly low-dose CT scan of the lungs is recommended if you:  Currently smoke.  Have a history of at least 30 pack-years of smoking and you currently smoke or have quit within the past 15 years. A pack-year is smoking an  average of one pack of cigarettes per day for one year.  Yearly screening should:  Continue until it has been 15 years since you quit.  Stop if you develop a health problem that would prevent you from having lung cancer treatment.  Colorectal Cancer  This type of cancer can be detected and can often be prevented.  Routine colorectal cancer screening usually begins at age 36 and continues through age 74.  If you have risk factors for colon cancer, your health care provider may recommend that you be screened at an earlier age.  If you have a family history of colorectal cancer, talk with your health care provider about genetic screening.  Your health care provider may also recommend using home test kits to check for hidden blood in your stool.  A small camera at the end of a tube can be used to examine your colon directly (sigmoidoscopy or colonoscopy). This is done to check for the earliest forms of colorectal cancer.  Direct examination of the colon should be repeated every 5-10 years until age 64. However, if early forms of precancerous polyps or small growths are found or if you have a family history or genetic risk for colorectal cancer, you may need to be screened more often.  Skin Cancer  Check your skin from head to toe regularly.  Monitor any moles. Be sure to tell your health care provider: ? About any new moles or changes in moles, especially if there is a change in a mole's shape or color. ? If you have a mole that is larger than the size of a pencil eraser.  If any of your family members has a history of skin cancer, especially at a young age, talk with your health care provider about genetic screening.  Always use sunscreen. Apply sunscreen liberally and repeatedly throughout the day.  Whenever you are outside, protect yourself by wearing long sleeves, pants, a wide-brimmed hat, and sunglasses.  What should I know about osteoporosis? Osteoporosis is a condition in  which bone destruction happens more quickly than new bone creation. After menopause, you may be at an increased risk for osteoporosis. To help prevent osteoporosis or the bone fractures that can happen because of osteoporosis, the following is recommended:  If you are 46-75 years old, get at least 1,000 mg of calcium and at least 600 mg of vitamin D per day.  If you are older than age 23 but younger than age 68, get at least 1,200 mg of calcium and at least 600 mg of vitamin D per day.  If you are older than age 66, get at least 1,200 mg of calcium and at least 800 mg of vitamin D per day.  Smoking and excessive alcohol intake increase the risk of osteoporosis. Eat foods that are rich in calcium and  vitamin D, and do weight-bearing exercises several times each week as directed by your health care provider. What should I know about how menopause affects my mental health? Depression may occur at any age, but it is more common as you become older. Common symptoms of depression include:  Low or sad mood.  Changes in sleep patterns.  Changes in appetite or eating patterns.  Feeling an overall lack of motivation or enjoyment of activities that you previously enjoyed.  Frequent crying spells.  Talk with your health care provider if you think that you are experiencing depression. What should I know about immunizations? It is important that you get and maintain your immunizations. These include:  Tetanus, diphtheria, and pertussis (Tdap) booster vaccine.  Influenza every year before the flu season begins.  Pneumonia vaccine.  Shingles vaccine.  Your health care provider may also recommend other immunizations. This information is not intended to replace advice given to you by your health care provider. Make sure you discuss any questions you have with your health care provider. Document Released: 04/27/2005 Document Revised: 09/23/2015 Document Reviewed: 12/07/2014 Elsevier Interactive  Patient Education  2018 [employer].

## 2017-08-25 NOTE — Assessment & Plan Note (Signed)
LDL and triglycerides are at goal on diet alone .  Family history remains her only risk factor for early CAD   Lab Results  Component Value Date   CHOL 158 08/23/2017   HDL 64.20 08/23/2017   LDLCALC 82 08/23/2017   LDLDIRECT 77.0 09/24/2016   TRIG 61.0 08/23/2017   CHOLHDL 2 08/23/2017
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
## 2017-08-25 NOTE — Assessment & Plan Note (Addendum)
Annual comprehensive preventive exam was done as well as an evaluation and management of chronic conditions .  During the course of the visit the patient was educated and counseled about appropriate screening and preventive services including :  diabetes screening, lipid analysis with projected  10 year  risk for CAD  Which is below the threshold for treatment  using the Framingham risk calculator for women, , nutrition counseling, colorectal cancer screening, and recommended immunizations.  Printed recommendations for health maintenance screenings was given.   PAP smear done today

## 2017-08-26 LAB — RUBEOLA ANTIBODY IGG: RUBEOLA IGG: 137 [AU]/ml

## 2017-08-26 LAB — HIV ANTIBODY (ROUTINE TESTING W REFLEX): HIV 1&2 Ab, 4th Generation: NONREACTIVE

## 2017-08-28 LAB — CYTOLOGY - PAP
Diagnosis: NEGATIVE
HPV: NOT DETECTED

## 2017-08-29 DIAGNOSIS — K219 Gastro-esophageal reflux disease without esophagitis: Secondary | ICD-10-CM | POA: Diagnosis not present

## 2017-10-15 ENCOUNTER — Ambulatory Visit
Admission: RE | Admit: 2017-10-15 | Discharge: 2017-10-15 | Disposition: A | Discharge status: Home / Self Care | Payer: 59 | Source: Ambulatory Visit | Attending: Internal Medicine | Admitting: Internal Medicine

## 2017-10-15 DIAGNOSIS — Z1231 Encounter for screening mammogram for malignant neoplasm of breast: Secondary | ICD-10-CM | POA: Diagnosis not present

## 2017-10-15 DIAGNOSIS — Z1239 Encounter for other screening for malignant neoplasm of breast: Secondary | ICD-10-CM

## 2017-11-11 ENCOUNTER — Encounter: Payer: 59 | Admitting: Internal Medicine

## 2017-11-20 DIAGNOSIS — R0982 Postnasal drip: Secondary | ICD-10-CM | POA: Diagnosis not present

## 2017-11-25 ENCOUNTER — Ambulatory Visit
Admission: RE | Admit: 2017-11-25 | Discharge: 2017-11-25 | Disposition: A | Discharge status: Home / Self Care | Payer: 59 | Source: Ambulatory Visit | Attending: Unknown Physician Specialty | Admitting: Unknown Physician Specialty

## 2017-11-25 ENCOUNTER — Encounter
Admission: RE | Disposition: A | Discharge status: Home / Self Care | Payer: Self-pay | Source: Ambulatory Visit | Attending: Unknown Physician Specialty

## 2017-11-25 ENCOUNTER — Ambulatory Visit: Payer: 59 | Admitting: Anesthesiology

## 2017-11-25 ENCOUNTER — Encounter: Payer: Self-pay | Admitting: *Deleted

## 2017-11-25 DIAGNOSIS — K210 Gastro-esophageal reflux disease with esophagitis: Secondary | ICD-10-CM | POA: Diagnosis not present

## 2017-11-25 DIAGNOSIS — F419 Anxiety disorder, unspecified: Secondary | ICD-10-CM | POA: Insufficient documentation

## 2017-11-25 DIAGNOSIS — Z79899 Other long term (current) drug therapy: Secondary | ICD-10-CM | POA: Insufficient documentation

## 2017-11-25 DIAGNOSIS — K219 Gastro-esophageal reflux disease without esophagitis: Secondary | ICD-10-CM | POA: Insufficient documentation

## 2017-11-25 DIAGNOSIS — K3189 Other diseases of stomach and duodenum: Secondary | ICD-10-CM | POA: Diagnosis not present

## 2017-11-25 DIAGNOSIS — K2970 Gastritis, unspecified, without bleeding: Secondary | ICD-10-CM | POA: Diagnosis not present

## 2017-11-25 DIAGNOSIS — K2950 Unspecified chronic gastritis without bleeding: Secondary | ICD-10-CM | POA: Diagnosis not present

## 2017-11-25 DIAGNOSIS — K2960 Other gastritis without bleeding: Secondary | ICD-10-CM | POA: Diagnosis not present

## 2017-11-25 DIAGNOSIS — E785 Hyperlipidemia, unspecified: Secondary | ICD-10-CM | POA: Insufficient documentation

## 2017-11-25 HISTORY — PX: ESOPHAGOGASTRODUODENOSCOPY (EGD) WITH PROPOFOL: SHX5813

## 2017-11-25 SURGERY — ESOPHAGOGASTRODUODENOSCOPY (EGD) WITH PROPOFOL
Anesthesia: General

## 2017-11-25 MED ORDER — MIDAZOLAM HCL 5 MG/5ML IJ SOLN
INTRAMUSCULAR | Status: DC | PRN
  Administered 2017-11-25: 2 mg via INTRAVENOUS

## 2017-11-25 MED ORDER — SODIUM CHLORIDE 0.9 % IV SOLN
INTRAVENOUS | Status: DC
  Administered 2017-11-25: 1000 mL via INTRAVENOUS

## 2017-11-25 MED ORDER — PROPOFOL 500 MG/50ML IV EMUL
INTRAVENOUS | Status: AC
  Filled 2017-11-25: qty 50

## 2017-11-25 MED ORDER — FENTANYL CITRATE (PF) 100 MCG/2ML IJ SOLN
INTRAMUSCULAR | Status: DC | PRN
  Administered 2017-11-25 (×2): 50 ug via INTRAVENOUS

## 2017-11-25 MED ORDER — GLYCOPYRROLATE 0.2 MG/ML IJ SOLN
INTRAMUSCULAR | Status: DC | PRN
  Administered 2017-11-25: 0.2 mg via INTRAVENOUS

## 2017-11-25 MED ORDER — MIDAZOLAM HCL 2 MG/2ML IJ SOLN
INTRAMUSCULAR | Status: AC
  Filled 2017-11-25: qty 2

## 2017-11-25 MED ORDER — LIDOCAINE HCL (PF) 2 % IJ SOLN
INTRAMUSCULAR | Status: AC
  Filled 2017-11-25: qty 10

## 2017-11-25 MED ORDER — SODIUM CHLORIDE 0.9 % IV SOLN: INTRAVENOUS | Status: DC

## 2017-11-25 MED ORDER — FENTANYL CITRATE (PF) 100 MCG/2ML IJ SOLN
INTRAMUSCULAR | Status: AC
  Filled 2017-11-25: qty 2

## 2017-11-25 MED ORDER — PROPOFOL 500 MG/50ML IV EMUL
INTRAVENOUS | Status: DC | PRN
  Administered 2017-11-25: 50 ug/kg/min via INTRAVENOUS

## 2017-11-25 MED ORDER — PROPOFOL 10 MG/ML IV BOLUS
INTRAVENOUS | Status: DC | PRN
  Administered 2017-11-25 (×2): 20 mg via INTRAVENOUS

## 2017-11-25 MED ORDER — LIDOCAINE HCL (PF) 2 % IJ SOLN
INTRAMUSCULAR | Status: DC | PRN
  Administered 2017-11-25: 50 mg

## 2017-11-25 MED ORDER — GLYCOPYRROLATE 0.2 MG/ML IJ SOLN
INTRAMUSCULAR | Status: AC
  Filled 2017-11-25: qty 1

## 2017-11-25 NOTE — H&P (Signed)
Primary Care Physician:  [name], MD Primary Gastroenterologist:  Dr. Vira Agar  Pre-Procedure History & Physical: HPI:  [name] [name] is a 62 y.o. female is here for an endoscopy.  Last test 10/2011.  Problem with GERD.   Past Medical History:  Diagnosis Date  . Allergy   . Anxiety   . Hyperlipidemia     Past Surgical History:  Procedure Laterality Date  . MANDIBLE FRACTURE SURGERY      Prior to Admission medications   Medication Sig Start Date End Date Taking? Authorizing Provider  Cholecalciferol (VITAMIN D3) 1000 units CAPS Take 1 capsule by mouth daily.   Yes [provider]  ranitidine (ZANTAC) 150 MG capsule Take 150 mg by mouth 2 (two) times daily.   Yes [provider]  vitamin B-12 (CYANOCOBALAMIN) 1000 MCG tablet Take 1,000 mcg by mouth daily.   Yes [provider]    Allergies as of 11/22/2017 - Review Complete 11/22/2017  Allergen Reaction Noted  . Codeine  06/07/2011  . Singulair [montelukast sodium] Rash 06/03/2013    Family History  Problem Relation Age of Onset  . Hyperlipidemia Mother   . Diabetes Mother   . COPD Mother   . Heart disease Mother        smoker  . Cancer Mother        lung CA mets   . Hyperlipidemia Father   . Heart disease Father 58       ami, smoker  . Early death Father   . Stroke Maternal Grandmother   . Stroke Sister   . COPD Sister   . Cancer Sister 68       lymphoma  . Diabetes Brother   . Breast cancer Neg Hx     Social History   Socioeconomic History  . Marital status: Married    Spouse name: Not on file  . Number of children: Not on file  . Years of education: Not on file  . Highest education level: Not on file  Occupational History  . Not on file  Social Needs  . Financial resource strain: Not on file  . Food insecurity:    Worry: Not on file    Inability: Not on file  . Transportation needs:    Medical: Not on file    Non-medical: Not on file  Tobacco Use  .  Smoking status: Never Smoker  . Smokeless tobacco: Never Used  Substance and Sexual Activity  . Alcohol use: No  . Drug use: Never  . Sexual activity: Not on file  Lifestyle  . Physical activity:    Days per week: Not on file    Minutes per session: Not on file  . Stress: Not on file  Relationships  . Social connections:    Talks on phone: Not on file    Gets together: Not on file    Attends religious service: Not on file    Active member of club or organization: Not on file    Attends meetings of clubs or organizations: Not on file    Relationship status: Not on file  . Intimate partner violence:    Fear of current or ex partner: Not on file    Emotionally abused: Not on file    Physically abused: Not on file    Forced sexual activity: Not on file  Other Topics Concern  . Not on file  Social History Narrative   Lives in [place]. Works at [employer].  Review of Systems: See HPI, otherwise negative ROS  Physical Exam: BP (!) 109/56   Pulse 65   Temp (!) 97 F (36.1 C) (Tympanic)   Resp 16   Ht 5\' 4"  (1.626 m)   Wt 56.7 kg   LMP 11/20/2012   SpO2 100%   BMI 21.46 kg/m  General:   Alert,  pleasant and cooperative in NAD Head:  Normocephalic and atraumatic. Neck:  Supple; no masses or thyromegaly. Lungs:  Clear throughout to auscultation.    Heart:  Regular rate and rhythm. Abdomen:  Soft, nontender and nondistended. Normal bowel sounds, without guarding, and without rebound.   Neurologic:  Alert and  oriented x4;  grossly normal neurologically.  Impression/Plan: [name] [name] is here for an endoscopy to be performed for GERD.  Risks, benefits, limitations, and alternatives regarding  endoscopy have been reviewed with the patient.  Questions have been answered.  All parties agreeable.   [name] Cheers, MD  11/25/2017, 10:25 AM

## 2017-11-25 NOTE — Op Note (Signed)
[facility] Gastroenterology Patient Name: [name] [name] Procedure Date: 11/25/2017 10:23 AM MRN: [id number] Account #: [id number] Date of Birth: 1956/02/16 Admit Type: Outpatient Age: 62 Room: [facility] ENDO ROOM 1 Gender: Female Note Status: Finalized Procedure:            Upper GI endoscopy Indications:          Heartburn Providers:            Manya Silvas, MD Referring MD:         Deborra Medina, MD (Referring MD) Medicines:            Propofol per Anesthesia Complications:        No immediate complications. Procedure:            Pre-Anesthesia Assessment:                       - After reviewing the risks and benefits, the patient                        was deemed in satisfactory condition to undergo the                        procedure.                       After obtaining informed consent, the endoscope was                        passed under direct vision. Throughout the procedure,                        the patient's blood pressure, pulse, and oxygen                        saturations were monitored continuously. The Endoscope                        was introduced through the mouth, and advanced to the                        second part of duodenum. The upper GI endoscopy was                        accomplished without difficulty. The patient tolerated                        the procedure well. Findings:      The area of the hypopharynx is very snug and the cervical spine my be       pressing on her esophageal/laryngeal causing symptons causing her       problems. The scope (although the small scope was used)passed with mild       difficulty/.      The Z-line was irregular and was found 40 cm from the incisors. Biopsy       done at [facility].      Localized mildly erythematous mucosa without bleeding was found in the       gastric antrum. Biopsies were taken with a cold forceps for histology.       Biopsies were taken with a cold forceps for Helicobacter  pylori testing.      The examined  duodenum was normal. Impression:           - Z-line irregular, 40 cm from the incisors.                       - Erythematous mucosa in the antrum. Biopsied.                       - Normal examined duodenum. Recommendation:       - Await pathology results. Manya Silvas, MD 11/25/2017 10:45:25 AM This report has been signed electronically. Number of Addenda: 0 Note Initiated On: 11/25/2017 10:23 AM      [facility]

## 2017-11-25 NOTE — Anesthesia Postprocedure Evaluation (Signed)
Anesthesia Post Note  Patient: [name] [name]  Procedure(s) Performed: ESOPHAGOGASTRODUODENOSCOPY (EGD) WITH PROPOFOL (N/A )  Patient location during evaluation: PACU Anesthesia Type: General Level of consciousness: awake and alert Pain management: pain level controlled Vital Signs Assessment: post-procedure vital signs reviewed and stable Respiratory status: spontaneous breathing, nonlabored ventilation, respiratory function stable and patient connected to nasal cannula oxygen Cardiovascular status: blood pressure returned to baseline and stable Postop Assessment: no apparent nausea or vomiting Anesthetic complications: no     Last Vitals:  Vitals:   11/25/17 1051 11/25/17 1101  BP: (!) 81/61 (!) 110/59  Pulse: 81 74  Resp: 16 18  Temp:    SpO2: 99% 98%    Last Pain:  Vitals:   11/25/17 1101  TempSrc:   PainSc: 0-No pain                 Molli Barrows

## 2017-11-25 NOTE — Anesthesia Post-op Follow-up Note (Signed)
Anesthesia QCDR form completed.        

## 2017-11-25 NOTE — Anesthesia Preprocedure Evaluation (Signed)
Anesthesia Evaluation  Patient identified by MRN, date of birth, ID band Patient awake    Reviewed: Allergy & Precautions, H&P , NPO status , Patient's Chart, lab work & pertinent test results, reviewed documented beta blocker date and time   Airway Mallampati: II   Neck ROM: full    Dental  (+) Poor Dentition   Pulmonary neg pulmonary ROS,    Pulmonary exam normal        Cardiovascular Exercise Tolerance: Good negative cardio ROS Normal cardiovascular exam Rhythm:regular Rate:Normal     Neuro/Psych Anxiety negative neurological ROS  negative psych ROS   GI/Hepatic negative GI ROS, Neg liver ROS, GERD  Medicated,  Endo/Other  negative endocrine ROS  Renal/GU negative Renal ROS  negative genitourinary   Musculoskeletal   Abdominal   Peds  Hematology negative hematology ROS (+)   Anesthesia Other Findings Past Medical History: No date: Allergy No date: Anxiety No date: Hyperlipidemia Past Surgical History: No date: MANDIBLE FRACTURE SURGERY BMI    Body Mass Index:  21.46 kg/m     Reproductive/Obstetrics negative OB ROS                             Anesthesia Physical Anesthesia Plan  ASA: II  Anesthesia Plan: General   Post-op Pain Management:    Induction:   PONV Risk Score and Plan:   Airway Management Planned:   Additional Equipment:   Intra-op Plan:   Post-operative Plan:   Informed Consent: I have reviewed the patients History and Physical, chart, labs and discussed the procedure including the risks, benefits and alternatives for the proposed anesthesia with the patient or authorized representative who has indicated his/her understanding and acceptance.   Dental Advisory Given  Plan Discussed with: CRNA  Anesthesia Plan Comments:         Anesthesia Quick Evaluation

## 2017-11-25 NOTE — Transfer of Care (Signed)
Immediate Anesthesia Transfer of Care Note  Patient: [name] [name]  Procedure(s) Performed: ESOPHAGOGASTRODUODENOSCOPY (EGD) WITH PROPOFOL (N/A )  Patient Location: PACU  Anesthesia Type:General  Level of Consciousness: sedated  Airway & Oxygen Therapy: Patient Spontanous Breathing and Patient connected to nasal cannula oxygen  Post-op Assessment: Report given to RN and Post -op Vital signs reviewed and stable  Post vital signs: Reviewed and stable  Last Vitals:  Vitals Value Taken Time  BP    Temp    Pulse    Resp    SpO2      Last Pain:  Vitals:   11/25/17 0948  TempSrc: Tympanic  PainSc: 0-No pain         Complications: No apparent anesthesia complications

## 2017-11-26 ENCOUNTER — Encounter: Payer: Self-pay | Admitting: Unknown Physician Specialty

## 2017-11-26 LAB — SURGICAL PATHOLOGY

## 2018-02-26 ENCOUNTER — Ambulatory Visit: Payer: 59 | Admitting: Internal Medicine

## 2018-02-26 ENCOUNTER — Encounter: Payer: Self-pay | Admitting: Internal Medicine

## 2018-02-26 DIAGNOSIS — K2100 Gastro-esophageal reflux disease with esophagitis, without bleeding: Secondary | ICD-10-CM

## 2018-02-26 DIAGNOSIS — B9789 Other viral agents as the cause of diseases classified elsewhere: Secondary | ICD-10-CM

## 2018-02-26 DIAGNOSIS — J069 Acute upper respiratory infection, unspecified: Secondary | ICD-10-CM

## 2018-02-26 MED ORDER — AMOXICILLIN-POT CLAVULANATE 875-125 MG PO TABS: 1.0000 | ORAL_TABLET | Freq: Two times a day (BID) | ORAL | 0 refills | Status: DC

## 2018-02-26 MED ORDER — PREDNISONE 10 MG PO TABS: ORAL_TABLET | ORAL | 0 refills | Status: DC

## 2018-02-26 NOTE — Progress Notes (Signed)
Subjective:  Patient ID: [name] [name], female    DOB: 1956/02/18  Age: 62 y.o. MRN: [id number]  CC: Diagnoses of Gastroesophageal reflux disease with esophagitis and Viral URI with cough were pertinent to this visit.  HPI [name] [name] presents for  6 month follow up on chronic conditions .  GERD: had EGD in September.  Worried about using ranitidine   URI :Caught a cold working at [employer] , started with allergies  Then sore throat and cough from PND,  Now with parietal headache no sinus tender ness and no  Ear tenderness    Outpatient Medications Prior to Visit  Medication Sig Dispense Refill  . Cholecalciferol (VITAMIN D3) 1000 units CAPS Take 1 capsule by mouth daily.    . ranitidine (ZANTAC) 150 MG capsule Take 150 mg by mouth 2 (two) times daily.    . vitamin B-12 (CYANOCOBALAMIN) 1000 MCG tablet Take 1,000 mcg by mouth daily.     No facility-administered medications prior to visit.     Review of Systems;  Patient denies headache, fevers, malaise, unintentional weight loss, skin rash, eye pain, sinus congestion and sinus pain, sore throat, dysphagia,  hemoptysis , cough, dyspnea, wheezing, chest pain, palpitations, orthopnea, edema, abdominal pain, nausea, melena, diarrhea, constipation, flank pain, dysuria, hematuria, urinary  Frequency, nocturia, numbness, tingling, seizures,  Focal weakness, Loss of consciousness,  Tremor, insomnia, depression, anxiety, and suicidal ideation.      Objective:  BP 128/66 (BP Location: Left Arm, Patient Position: Sitting, Cuff Size: Normal)   Pulse 80   Temp 97.7 F (36.5 C) (Oral)   Resp 14   Ht 5\' 4"  (1.626 m)   Wt 126 lb 3.2 oz (57.2 kg)   LMP 11/20/2012   SpO2 98%   BMI 21.66 kg/m   BP Readings from Last 3 Encounters:  02/26/18 128/66  11/25/17 (!) 110/59  08/23/17 106/62    Wt Readings from Last 3 Encounters:  02/26/18 126 lb 3.2 oz (57.2 kg)  11/25/17 125 lb (56.7 kg)  08/23/17 125 lb (56.7 kg)    General  appearance: alert, cooperative and appears stated age Ears: normal TM's and external ear canals both ears Throat: lips, mucosa, and tongue normal; teeth and gums normal Neck: no adenopathy, no carotid bruit, supple, symmetrical, trachea midline and thyroid not enlarged, symmetric, no tenderness/mass/nodules Back: symmetric, no curvature. ROM normal. No CVA tenderness. Lungs: clear to auscultation bilaterally Heart: regular rate and rhythm, S1, S2 normal, no murmur, click, rub or gallop Abdomen: soft, non-tender; bowel sounds normal; no masses,  no organomegaly Pulses: 2+ and symmetric Skin: Skin color, texture, turgor normal. No rashes or lesions Lymph nodes: Cervical, supraclavicular, and axillary nodes normal.  No results found for: HGBA1C  Lab Results  Component Value Date   CREATININE 0.62 08/23/2017   CREATININE 0.70 09/24/2016   CREATININE 0.61 07/14/2015    Lab Results  Component Value Date   WBC 4.8 09/24/2016   HGB 13.3 09/24/2016   HCT 39.7 09/24/2016   PLT 217.0 09/24/2016   GLUCOSE 92 08/23/2017   CHOL 158 08/23/2017   TRIG 61.0 08/23/2017   HDL 64.20 08/23/2017   LDLDIRECT 77.0 09/24/2016   LDLCALC 82 08/23/2017   ALT 12 08/23/2017   AST 15 08/23/2017   NA 140 08/23/2017   K 4.2 08/23/2017   CL 104 08/23/2017   CREATININE 0.62 08/23/2017   BUN 15 08/23/2017   CO2 30 08/23/2017   TSH 0.92 08/23/2017    No results found.  Assessment & Plan:   Problem List Items Addressed This Visit    GERD (gastroesophageal reflux disease)    Discussed current controversy regarding prolonged use of PPI in patients without documented Barretts esophagus.  Patient has had a recent  EGD and has been taking ranitidine.   Suggested trial of pepcid 20 mg daily.  If GERD symptoms return,  Will upgrade to PPI short term       Viral URI with cough    This URI is most likely viral given the failure of mild HEENT  symptoms to resolve after a z pack.  I have explained that in viral  URIS, an antibiotic will not help the symptoms and will increase the risk of developing diarrhea.,  Continue oral and nasal decongestants,  Ibuprofen 400 mg and tylenol 650 mq 8 hrs for aches and pains,  And will add steroid taper and tessalon 100 mg every 8 hours prn cough  And add abx only if symptoms worsen to include fevers, facial pain, purulent sputum./drainage.        A total of 25 minutes of face to face time was spent with patient more than half of which was spent in counselling about the above mentioned conditions  and coordination of care   I am having Derek Jack start on predniSONE and amoxicillin-clavulanate. I am also having her maintain her Vitamin D3, vitamin B-12, and ranitidine.  Meds ordered this encounter  Medications  . predniSONE (DELTASONE) 10 MG tablet    Sig: 6 tablets on Day 1 , then reduce by 1 tablet daily until gone    Dispense:  21 tablet    Refill:  0  . amoxicillin-clavulanate (AUGMENTIN) 875-125 MG tablet    Sig: Take 1 tablet by mouth 2 (two) times daily.    Dispense:  14 tablet    Refill:  0    There are no discontinued medications.  Follow-up: Return in about 6 months (around 08/28/2018) for CPE.   [name], MD

## 2018-02-26 NOTE — Patient Instructions (Addendum)
.  You have a viral syndrome which is causing bronchitis.    The post nasal drip may also be contributing to  your  Cough.  I am prescribing a prednisone taper  to manage the inflammation in your bronchial tubes   I also advise use of the following OTC meds to help with your other symptoms.   Continue generic OTC benadryl 25 mg at bedtime for the drainage,  Delsym for daytime cough.  Your coughing may last 7 to 10 days and you may develop a low grade fever ( T > 100.4,  les than 102) but if you develop Green nasal discharge,  Ear  Or facial pain, start the antibiotic I have given you a prescription for  And take it with food twice daily for 7 days   Please take a probiotic ( Align, Floraque or Culturelle) or the generic equivalent  while you are on the antibiotic to prevent a serious antibiotic associated diarrhea  Called clostridium dificile colitis and a vaginal yeast infection    For your GERD and gastritis   I appreciate your concern about taking a  PPI in light of the recently published studies suggesting an association with increased risk of dementia and kidney failure.  I advise you to try switching  From your ranitidine to  famotidine 20 mg once or twice daily.  This is also a H2 blocker and is availabe at [facility]  without a prescription.     I also recommend sleeping on a wedge.  (place your pillow on the wedge )      You are over due for tetanus vaccine  Please check with employee health (last one 2007 per our records(    Try treating your toenail fungus naturally with vinegar or tea tree oil dabbed on the top of the nail daily after a few strokes with a disposable emery board

## 2018-03-01 ENCOUNTER — Encounter: Payer: Self-pay | Admitting: Internal Medicine

## 2018-03-01 DIAGNOSIS — B9789 Other viral agents as the cause of diseases classified elsewhere: Secondary | ICD-10-CM

## 2018-03-01 DIAGNOSIS — J069 Acute upper respiratory infection, unspecified: Secondary | ICD-10-CM | POA: Insufficient documentation

## 2018-03-01 NOTE — Assessment & Plan Note (Signed)
This URI is most likely viral given the failure of mild HEENT  symptoms to resolve after a z pack.  I have explained that in viral URIS, an antibiotic will not help the symptoms and will increase the risk of developing diarrhea.,  Continue oral and nasal decongestants,  Ibuprofen 400 mg and tylenol 650 mq 8 hrs for aches and pains,  And will add steroid taper and tessalon 100 mg every 8 hours prn cough  And add abx only if symptoms worsen to include fevers, facial pain, purulent sputum./drainage.

## 2018-03-01 NOTE — Assessment & Plan Note (Signed)
Discussed current controversy regarding prolonged use of PPI in patients without documented Barretts esophagus.  Patient has had a recent  EGD and has been taking ranitidine.   Suggested trial of pepcid 20 mg daily.  If GERD symptoms return,  Will upgrade to PPI short term

## 2018-03-03 DIAGNOSIS — H5212 Myopia, left eye: Secondary | ICD-10-CM | POA: Diagnosis not present

## 2018-03-17 ENCOUNTER — Ambulatory Visit: Payer: Self-pay | Admitting: *Deleted

## 2018-03-17 NOTE — Telephone Encounter (Signed)
Pt called with complaints of anxiety; she states that she bought a fit bit and she is panicking because her heart rate has remained up;she says that her HR goes from 78 to 90's when working around the house;  she is also concerned that her previously diagnosed respiratory symptoms are better after antibiotic and prednisone but she wonders if she needs a chest xray; recommendations made per nurse triage protocol; the pt normally sees Dr Derrel Nip but this provider has no availability until 04/14/18; the pt does not want to wait this long to be seen; pt offered and accepted appointment with Philis Nettle, [place], 03/21/17 at 1540; will route to office for notification of this upcoming appointment.                Reason for Disposition . Problems with anxiety or stress  Answer Assessment - Initial Assessment Questions 1. DESCRIPTION: "Please describe your heart rate or heart beat that you are having" (e.g., fast/slow, regular/irregular, skipped or extra beats, "palpitations")     fast 2. ONSET: "When did it start?" (Minutes, hours or days)      03/15/18 3. DURATION: "How long does it last" (e.g., seconds, minutes, hours)     seconds 4. PATTERN "Does it come and go, or has it been constant since it started?"  "Does it get worse with exertion?"   "Are you feeling it now?"     intermittent 5. TAP: "Using your hand, can you tap out what you are feeling on a chair or table in front of you, so that I can hear?" (Note: not all patients can do this)       n/a 6. HEART RATE: "Can you tell me your heart rate?" "How many beats in 15 seconds?"  (Note: not all patients can do this)       70s with fit bit 7. RECURRENT SYMPTOM: "Have you ever had this before?" If so, ask: "When was the last time?" and "What happened that time?"      no 8. CAUSE: "What do you think is causing the palpitations?"     anxiety 9. CARDIAC HISTORY: "Do you have any history of heart disease?" (e.g., heart attack, angina, bypass surgery,  angioplasty, arrhythmia)      no 10. OTHER SYMPTOMS: "Do you have any other symptoms?" (e.g., dizziness, chest pain, sweating, difficulty breathing)       no 11. PREGNANCY: "Is there any chance you are pregnant?" "When was your last menstrual period?"       no  Protocols used: HEART RATE AND HEARTBEAT QUESTIONS-A-AH

## 2018-03-21 ENCOUNTER — Encounter: Payer: Self-pay | Admitting: Family Medicine

## 2018-03-21 ENCOUNTER — Ambulatory Visit (INDEPENDENT_AMBULATORY_CARE_PROVIDER_SITE_OTHER): Payer: 59

## 2018-03-21 ENCOUNTER — Ambulatory Visit: Payer: 59 | Admitting: Family Medicine

## 2018-03-21 VITALS — BP 122/60 | HR 76 | Temp 98.1°F | Ht 64.0 in | Wt 125.4 lb

## 2018-03-21 DIAGNOSIS — F418 Other specified anxiety disorders: Secondary | ICD-10-CM | POA: Diagnosis not present

## 2018-03-21 DIAGNOSIS — R009 Unspecified abnormalities of heart beat: Secondary | ICD-10-CM | POA: Diagnosis not present

## 2018-03-21 DIAGNOSIS — I499 Cardiac arrhythmia, unspecified: Secondary | ICD-10-CM | POA: Diagnosis not present

## 2018-03-21 DIAGNOSIS — Z23 Encounter for immunization: Secondary | ICD-10-CM

## 2018-03-21 NOTE — Patient Instructions (Signed)
EKG is normal  Normal resting HR is 60-100  If your heart rate goes up into 150-160s with exercise or physical activity, this is OK as long as it goes back down with rest

## 2018-03-21 NOTE — Progress Notes (Signed)
Subjective:    Patient ID: [name] [name], female    DOB: 06-25-1955, 63 y.o.   MRN: [id number]  HPI   Patient presents to clinic due to concerns over her heart rate.  Patient got a new Fitbit watch for her birthday, and rolled her while she can see her heart rate.  Patient states she notices that her heart rate will go from 70-90 with minimal activity.  Also states at 1 point after doing different chores around her house, including carrying laundry upstairs, she glanced down at her watch dose her heart rate was at 120.  States she sat down took some deep breaths and rested and the heart rate went back down to in the 80s.  Patient states she wonders that now that she has is Fitbit watch and she could see her heart rate, she is hyper aware and is making her panic.  Denies any chest pain.  Denies shortness of breath or wheezing.  Denies feeling faint or dizzy.  Denies any jaw pain or arm pain.  Denies any feelings of palpitations or racing heart.  Patient Active Problem List   Diagnosis Date Noted  . Viral URI with cough 03/01/2018  . Vitamin D deficiency 01/16/2017  . Varicose veins of both lower extremities 07/20/2015  . Urethral polyp 05/23/2014  . Osteopenia 09/30/2013  . Menopause present, declines hormone replacement therapy 04/15/2012  . Allergic rhinitis due to dust 04/14/2012  . Hyperlipidemia 07/17/2011  . Encounter for general adult medical examination with abnormal findings 07/17/2011  . GERD (gastroesophageal reflux disease) 06/07/2011   Social History   Tobacco Use  . Smoking status: Never Smoker  . Smokeless tobacco: Never Used  Substance Use Topics  . Alcohol use: No   Review of Systems   Constitutional: Negative for chills, fatigue and fever.  HENT: Negative for congestion, ear pain, sinus pain and sore throat.   Eyes: Negative.   Respiratory: Negative for cough, shortness of breath and wheezing.   Cardiovascular: Negative for chest pain, palpitations and leg  swelling. HR goes from 70-90, 120 with activity.  Gastrointestinal: Negative for abdominal pain, diarrhea, nausea and vomiting.  Genitourinary: Negative for dysuria, frequency and urgency.  Musculoskeletal: Negative for arthralgias and myalgias.  Skin: Negative for color change, pallor and rash.  Neurological: Negative for syncope, light-headedness and headaches.  Psychiatric/Behavioral: The patient is not nervous/anxious.       Objective:   Physical Exam  Constitutional: She appears well-developed and well-nourished. No distress.  HENT:  Head: Normocephalic and atraumatic.  Eyes: Pupils are equal, round, and reactive to light. EOM are normal. No scleral icterus.  Neck: Normal range of motion. Neck supple. No tracheal deviation present.  Cardiovascular: Normal rate, regular rhythm and normal heart sounds.  Pulmonary/Chest: Effort normal and breath sounds normal. No respiratory distress. She has no wheezes. She has no rales.  Abdominal: Soft. Bowel sounds are normal. There is no tenderness.  Neurological: She is alert and oriented to person, place, and time.  Gait normal  Skin: Skin is warm and dry. No pallor.  Psychiatric: She has a normal mood and affect. Her behavior is normal. Thought content normal.   Nursing note and vitals reviewed.     Vitals:   03/21/18 1541  BP: 122/60  Pulse: 76  Temp: 98.1 F (36.7 C)  SpO2: 98%   Assessment & Plan:   Heart rate issues/anxiety about health - EKG performed in clinic & reviewed by me, this shows normal sinus rhythm  and normal heart rate.  Patient reassured that a heart rate in the 70-90 range is normal, and a heart rate up in the 120 range wrist activity is considered normal as well as long as the heart rate returns back to the normal range with rest.  Also advised that if she is doing more physical type exercise her heart rate may go even higher up into the 160 range, but again as long as it returns to a normal rate between 60-100  while resting, then this is okay.  We will also get chest x-ray in clinic today to look at the lungs and heart.   Tetanus booster given in clinic today.  Patient feels reassured after today's visit.  She will keep regularly scheduled follow-up with PCP as planned.  Advised to return to clinic anytime if issues arise.

## 2018-05-05 DIAGNOSIS — R110 Nausea: Secondary | ICD-10-CM | POA: Diagnosis not present

## 2018-05-05 DIAGNOSIS — K2970 Gastritis, unspecified, without bleeding: Secondary | ICD-10-CM | POA: Diagnosis not present

## 2018-05-05 DIAGNOSIS — K210 Gastro-esophageal reflux disease with esophagitis: Secondary | ICD-10-CM | POA: Diagnosis not present

## 2018-08-07 ENCOUNTER — Other Ambulatory Visit: Payer: Self-pay | Admitting: Student

## 2018-08-07 DIAGNOSIS — R599 Enlarged lymph nodes, unspecified: Secondary | ICD-10-CM

## 2018-08-07 DIAGNOSIS — K2970 Gastritis, unspecified, without bleeding: Secondary | ICD-10-CM | POA: Diagnosis not present

## 2018-08-07 DIAGNOSIS — K210 Gastro-esophageal reflux disease with esophagitis: Secondary | ICD-10-CM | POA: Diagnosis not present

## 2018-08-26 ENCOUNTER — Ambulatory Visit
Admission: RE | Admit: 2018-08-26 | Discharge: 2018-08-26 | Disposition: A | Discharge status: Home / Self Care | Payer: 59 | Source: Ambulatory Visit | Attending: Student | Admitting: Student

## 2018-08-26 ENCOUNTER — Other Ambulatory Visit: Payer: Self-pay

## 2018-08-26 DIAGNOSIS — R599 Enlarged lymph nodes, unspecified: Secondary | ICD-10-CM | POA: Insufficient documentation

## 2018-08-26 DIAGNOSIS — R590 Localized enlarged lymph nodes: Secondary | ICD-10-CM | POA: Diagnosis not present

## 2018-08-27 ENCOUNTER — Encounter: Payer: 59 | Admitting: Internal Medicine

## 2018-09-01 ENCOUNTER — Other Ambulatory Visit: Payer: Self-pay

## 2018-09-01 ENCOUNTER — Ambulatory Visit (INDEPENDENT_AMBULATORY_CARE_PROVIDER_SITE_OTHER): Payer: 59 | Admitting: Internal Medicine

## 2018-09-01 ENCOUNTER — Encounter: Payer: Self-pay | Admitting: Internal Medicine

## 2018-09-01 DIAGNOSIS — R590 Localized enlarged lymph nodes: Secondary | ICD-10-CM

## 2018-09-01 NOTE — Progress Notes (Signed)
Virtual Visit via [email or web address] This visit type was conducted due to national recommendations for restrictions regarding the COVID-19 pandemic (e.g. social distancing).  This format is felt to be most appropriate for this patient at this time.  All issues noted in this document were discussed and addressed.  No physical exam was performed (except for noted visual exam findings with Video Visits).   I connected with@ on 09/01/18 at  9:00 AM EDT by a video enabled telemedicine application and verified that I am speaking with the correct person using two identifiers. Location patient: home Location provider: work or home office Persons participating in the virtual visit: patient, provider  I discussed the limitations, risks, security and privacy concerns of performing an evaluation and management service by telephone and the availability of in person appointments. I also discussed with the patient that there may be a patient responsible charge related to this service. The patient expressed understanding and agreed to proceed.  Reason for visit:   FOLLOW UP ON SWOLLEN LYMPH NODE ON LEFT SIDE,  SEEN ON U/S ORDERED BY A PA at [facility] during follow up .  HPI:  Patient was evaluated in  February by a PA at the GI clinic for an  episode of prolonged nausea that followed ingestion of chocolate covered popcorn.  H2 blocker was changed to protonix in the am and famotidine in the pm but she could not swallow the protonix pill due to persistent feeling of globus. Medication was change to omeprazole daily with good results but during following up mentioned a tender nodular mass on the left side of her neck and an ultrasound was ordered pills .   The mass has been periodically tender and was irritated by the ultrasound procedure.  A subcentimenter enlarged LN was noted and further evaluation with CT scan was offered but not advised.  She has had no recent sinus or scalp infections.  She has had an 8 lb weight loss  but appetite is good and she attributes the loss to eating out less . Due to the COVID epidemic she is eating at home.  She denies night  sweats.  Still has ovaries.  Up to date on all screenings.    Last EGD was in 2019 and a small esophagus was noted.   ROS: See pertinent positives and negatives per HPI.  Past Medical History:  Diagnosis Date  . Allergy   . Anxiety   . Hyperlipidemia     Past Surgical History:  Procedure Laterality Date  . ESOPHAGOGASTRODUODENOSCOPY (EGD) WITH PROPOFOL N/A 11/25/2017   Procedure: ESOPHAGOGASTRODUODENOSCOPY (EGD) WITH PROPOFOL;  Surgeon: Manya Silvas, MD;  Location: [facility] ENDOSCOPY;  Service: Endoscopy;  Laterality: N/A;  . MANDIBLE FRACTURE SURGERY      Family History  Problem Relation Age of Onset  . Hyperlipidemia Mother   . Diabetes Mother   . COPD Mother   . Heart disease Mother        smoker  . Cancer Mother        lung CA mets   . Hyperlipidemia Father   . Heart disease Father 62       ami, smoker  . Early death Father   . Stroke Maternal Grandmother   . Stroke Sister   . COPD Sister   . Cancer Sister 65       lymphoma  . Diabetes Brother   . Breast cancer Neg Hx     SOCIAL HX:  reports that she has never smoked.  She has never used smokeless tobacco. She reports that she does not drink alcohol or use drugs.   Current Outpatient Medications:  .  Cholecalciferol (VITAMIN D3) 1000 units CAPS, Take 1 capsule by mouth daily., Disp: , Rfl:  .  omeprazole (PRILOSEC) 20 MG capsule, , Disp: , Rfl:  .  vitamin B-12 (CYANOCOBALAMIN) 1000 MCG tablet, Take 1,000 mcg by mouth daily., Disp: , Rfl:   EXAM:  VITALS per patient if applicable:  GENERAL: alert, oriented, appears well and in no acute distress  HEENT: atraumatic, conjunttiva clear, no obvious abnormalities on inspection of external nose and ears  NECK: normal movements of the head and neck.  Enlarge LN visible on left anterior cervical chain   LUNGS: on inspection no  signs of respiratory distress, breathing rate appears normal, no obvious gross SOB, gasping or wheezing  CV: no obvious cyanosis  MS: moves all visible extremities without noticeable abnormality  PSYCH/NEURO: pleasant and cooperative, no obvious depression or anxiety, speech and thought processing grossly intact  ASSESSMENT AND PLAN:  Discussed the following assessment and plan:  No diagnosis found.  No problem-specific Assessment & Plan notes found for this encounter.    I discussed the assessment and treatment plan with the patient. The patient was provided an opportunity to ask questions and all were answered. The patient agreed with the plan and demonstrated an understanding of the instructions.   The patient was advised to call back or seek an in-person evaluation if the symptoms worsen or if the condition fails to improve as anticipated.  I provided 25 minutes of non-face-to-face time during this encounter.   [name], MD

## 2018-09-02 ENCOUNTER — Other Ambulatory Visit: Payer: Self-pay

## 2018-09-02 ENCOUNTER — Other Ambulatory Visit: Payer: Self-pay | Admitting: Internal Medicine

## 2018-09-02 DIAGNOSIS — R590 Localized enlarged lymph nodes: Secondary | ICD-10-CM

## 2018-09-02 NOTE — Assessment & Plan Note (Signed)
Etiology unclear: reactive vs pathologic.  Has been enlarged for 3 months.  Workup would include biopsy of lymph node vs imaging of neck chest abd and pelvis.  Will discuss with Dr Tami Ribas

## 2018-09-03 ENCOUNTER — Ambulatory Visit (INDEPENDENT_AMBULATORY_CARE_PROVIDER_SITE_OTHER): Payer: 59 | Admitting: Internal Medicine

## 2018-09-03 ENCOUNTER — Encounter: Payer: Self-pay | Admitting: Internal Medicine

## 2018-09-03 ENCOUNTER — Other Ambulatory Visit: Payer: Self-pay

## 2018-09-03 VITALS — BP 96/64 | HR 87 | Temp 98.2°F | Resp 15 | Ht 64.0 in | Wt 119.8 lb

## 2018-09-03 DIAGNOSIS — Z1239 Encounter for other screening for malignant neoplasm of breast: Secondary | ICD-10-CM | POA: Diagnosis not present

## 2018-09-03 DIAGNOSIS — R590 Localized enlarged lymph nodes: Secondary | ICD-10-CM | POA: Diagnosis not present

## 2018-09-03 DIAGNOSIS — Z0001 Encounter for general adult medical examination with abnormal findings: Secondary | ICD-10-CM

## 2018-09-03 DIAGNOSIS — F418 Other specified anxiety disorders: Secondary | ICD-10-CM | POA: Diagnosis not present

## 2018-09-03 DIAGNOSIS — E559 Vitamin D deficiency, unspecified: Secondary | ICD-10-CM

## 2018-09-03 DIAGNOSIS — R4589 Other symptoms and signs involving emotional state: Secondary | ICD-10-CM

## 2018-09-03 DIAGNOSIS — M810 Age-related osteoporosis without current pathological fracture: Secondary | ICD-10-CM | POA: Diagnosis not present

## 2018-09-03 DIAGNOSIS — M85859 Other specified disorders of bone density and structure, unspecified thigh: Secondary | ICD-10-CM

## 2018-09-03 DIAGNOSIS — Z780 Asymptomatic menopausal state: Secondary | ICD-10-CM

## 2018-09-03 DIAGNOSIS — R5383 Other fatigue: Secondary | ICD-10-CM | POA: Diagnosis not present

## 2018-09-03 DIAGNOSIS — M8580 Other specified disorders of bone density and structure, unspecified site: Secondary | ICD-10-CM

## 2018-09-03 MED ORDER — ESCITALOPRAM OXALATE 5 MG PO TABS: 5.0000 mg | ORAL_TABLET | Freq: Every day | ORAL | 0 refills | Status: DC

## 2018-09-03 NOTE — Patient Instructions (Signed)
Please start the Lexapro (escitalopram) at 5 mg  daily in the morning with a meal  .  You can increase to 35mmg after 2 weeks if you have not developed side effects of nausea.   I have made a referral to Dr MTami Ribasto evaluate your lymph node   Follow up in one month  Your annual mammogram is due in July and  has been ordered.  You are encouraged (required) to [facility] also [facility]    Health Maintenance for Postmenopausal Women Menopause is a normal process in which your reproductive ability comes to an end. This process happens gradually over a span of months to years, usually between the ages of 462and 553 Menopause is complete when you have missed 12 consecutive menstrual periods. It is important to talk with your health care provider about some of the most common conditions that affect postmenopausal women, such as heart disease, cancer, and bone loss (osteoporosis). Adopting a healthy lifestyle and getting preventive care can help to promote your health and wellness. Those actions can also lower your chances of developing some of these common conditions. What should I know about menopause? During menopause, you may experience a number of symptoms, such as:  Moderate-to-severe hot flashes.  Night sweats.  Decrease in sex drive.  Mood swings.  Headaches.  Tiredness.  Irritability.  Memory problems.  Insomnia. Choosing to treat or not to treat menopausal changes is an individual decision that you make with your health care provider. What should I know about hormone replacement therapy and supplements? Hormone therapy products are effective for treating symptoms that are associated with menopause, such as hot flashes and night sweats. Hormone replacement carries certain risks, especially as you become older. If you are thinking about using estrogen or estrogen with progestin treatments, discuss the benefits and  risks with your health care provider. What should I know about heart disease and stroke? Heart disease, heart attack, and stroke become more likely as you age. This may be due, in part, to the hormonal changes that your body experiences during menopause. These can affect how your body processes dietary fats, triglycerides, and cholesterol. Heart attack and stroke are both medical emergencies. There are many things that you can do to help prevent heart disease and stroke:  Have your blood pressure checked at least every 1-2 years. High blood pressure causes heart disease and increases the risk of stroke.  If you are [id number] old, ask your health care provider if you should take aspirin to prevent a heart attack or a stroke.  Do not use any tobacco products, including cigarettes, chewing tobacco, or electronic cigarettes. If you need help quitting, ask your health care provider.  It is important to eat a healthy diet and maintain a healthy weight. ? Be sure to include plenty of vegetables, fruits, low-fat dairy products, and lean protein. ? Avoid eating foods that are high in solid fats, added sugars, or salt (sodium).  Get regular exercise. This is one of the most important things that you can do for your health. ? Try to exercise for at least 150 minutes each week. The type of exercise that you do should increase your heart rate and make you sweat. This is known as moderate-intensity exercise. ? Try to do strengthening exercises at least twice each week. Do these in addition to the moderate-intensity exercise.  Know your numbers.Ask your health care  provider to check your cholesterol and your blood glucose. Continue to have your blood tested as directed by your health care provider.  What should I know about cancer screening? There are several types of cancer. Take the following steps to reduce your risk and to catch any cancer development as early as possible. Breast Cancer  Practice  breast self-awareness. ? This means understanding how your breasts normally appear and feel. ? It also means doing regular breast self-exams. Let your health care provider know about any changes, no matter how small.  If you are 49 or older, have a clinician do a breast exam (clinical breast exam or CBE) every year. Depending on your age, family history, and medical history, it may be recommended that you also have a yearly breast X-ray (mammogram).  If you have a family history of breast cancer, talk with your health care provider about genetic screening.  If you are at high risk for breast cancer, talk with your health care provider about having an MRI and a mammogram every year.  Breast cancer (BRCA) gene test is recommended for women who have family members with BRCA-related cancers. Results of the assessment will determine the need for genetic counseling and BRCA1 and for BRCA2 testing. BRCA-related cancers include these types: ? Breast. This occurs in males or females. ? Ovarian. ? Tubal. This may also be called fallopian tube cancer. ? Cancer of the abdominal or pelvic lining (peritoneal cancer). ? Prostate. ? Pancreatic. Cervical, Uterine, and Ovarian Cancer Your health care provider may recommend that you be screened regularly for cancer of the pelvic organs. These include your ovaries, uterus, and vagina. This screening involves a pelvic exam, which includes checking for microscopic changes to the surface of your cervix (Pap test).  For women ages 21-65, health care providers may recommend a pelvic exam and a Pap test every three years. For women ages 13-65, they may recommend the Pap test and pelvic exam, combined with testing for human papilloma virus (HPV), every five years. Some types of HPV increase your risk of cervical cancer. Testing for HPV may also be done on women of any age who have unclear Pap test results.  Other health care providers may not recommend any screening for  nonpregnant women who are considered low risk for pelvic cancer and have no symptoms. Ask your health care provider if a screening pelvic exam is right for you.  If you have had past treatment for cervical cancer or a condition that could lead to cancer, you need Pap tests and screening for cancer for at least 20 years after your treatment. If Pap tests have been discontinued for you, your risk factors (such as having a new sexual partner) need to be reassessed to determine if you should start having screenings again. Some women have medical problems that increase the chance of getting cervical cancer. In these cases, your health care provider may recommend that you have screening and Pap tests more often.  If you have a family history of uterine cancer or ovarian cancer, talk with your health care provider about genetic screening.  If you have vaginal bleeding after reaching menopause, tell your health care provider.  There are currently no reliable tests available to screen for ovarian cancer. Lung Cancer Lung cancer screening is recommended for adults 20-60 years old who are at high risk for lung cancer because of a history of smoking. A yearly low-dose CT scan of the lungs is recommended if you:  Currently smoke.  Have a history of at least 30 pack-years of smoking and you currently smoke or have quit within the past 15 years. A pack-year is smoking an average of one pack of cigarettes per day for one year. Yearly screening should:  Continue until it has been 15 years since you quit.  Stop if you develop a health problem that would prevent you from having lung cancer treatment. Colorectal Cancer  This type of cancer can be detected and can often be prevented.  Routine colorectal cancer screening usually begins at age 28 and continues through age 36.  If you have risk factors for colon cancer, your health care provider may recommend that you be screened at an earlier age.  If you have  a family history of colorectal cancer, talk with your health care provider about genetic screening.  Your health care provider may also recommend using home test kits to check for hidden blood in your stool.  A small camera at the end of a tube can be used to examine your colon directly (sigmoidoscopy or colonoscopy). This is done to check for the earliest forms of colorectal cancer.  Direct examination of the colon should be repeated every 5-10 years until age 48. However, if early forms of precancerous polyps or small growths are found or if you have a family history or genetic risk for colorectal cancer, you may need to be screened more often. Skin Cancer  Check your skin from head to toe regularly.  Monitor any moles. Be sure to tell your health care provider: ? About any new moles or changes in moles, especially if there is a change in a mole's shape or color. ? If you have a mole that is larger than the size of a pencil eraser.  If any of your family members has a history of skin cancer, especially at a young age, talk with your health care provider about genetic screening.  Always use sunscreen. Apply sunscreen liberally and repeatedly throughout the day.  Whenever you are outside, protect yourself by wearing long sleeves, pants, a wide-brimmed hat, and sunglasses. What should I know about osteoporosis? Osteoporosis is a condition in which bone destruction happens more quickly than new bone creation. After menopause, you may be at an increased risk for osteoporosis. To help prevent osteoporosis or the bone fractures that can happen because of osteoporosis, the following is recommended:  If you are 61-21 years old, get at least 1,000 mg of calcium and at least 600 mg of vitamin D per day.  If you are older than age 45 but younger than age 36, get at least 1,200 mg of calcium and at least 600 mg of vitamin D per day.  If you are older than age 89, get at least 1,200 mg of calcium and  at least 800 mg of vitamin D per day. Smoking and excessive alcohol intake increase the risk of osteoporosis. Eat foods that are rich in calcium and vitamin D, and do weight-bearing exercises several times each week as directed by your health care provider. What should I know about how menopause affects my mental health? Depression may occur at any age, but it is more common as you become older. Common symptoms of depression include:  Low or sad mood.  Changes in sleep patterns.  Changes in appetite or eating patterns.  Feeling an overall lack of motivation or enjoyment of activities that you previously enjoyed.  Frequent crying spells. Talk with your health care provider if you think  that you are experiencing depression. What should I know about immunizations? It is important that you get and maintain your immunizations. These include:  Tetanus, diphtheria, and pertussis (Tdap) booster vaccine.  Influenza every year before the flu season begins.  Pneumonia vaccine.  Shingles vaccine. Your health care provider may also recommend other immunizations. This information is not intended to replace advice given to you by your health care provider. Make sure you discuss any questions you have with your health care provider. Document Released: 04/27/2005 Document Revised: 09/23/2015 Document Reviewed: 12/07/2014 Elsevier Interactive Patient Education  2019 [employer].

## 2018-09-03 NOTE — Progress Notes (Signed)
Patient ID: [name] [name], female    DOB: [birth year]  Age: 63 y.o. MRN: [id number]  The patient is here for preventive examination and management of other chronic and acute problems.   The risk factors are reflected in the social history.  The roster of all physicians providing medical care to patient - is listed in the Snapshot section of the chart.  Activities of daily living:  The patient is 100% independent in all ADLs: dressing, toileting, feeding as well as independent mobility  Home safety : The patient has smoke detectors in the home. They wear seatbelts.  There are no firearms at home. There is no violence in the home.   There is no risks for hepatitis, STDs or HIV. There is no   history of blood transfusion. They have no travel history to infectious disease endemic areas of the world.  The patient has seen their dentist in the last six month. They have seen their eye doctor in the last year. She denies hearing difficulty with regard to whispered voices and some television programs.  She does not  have excessive sun exposure. Discussed the need for sun protection: hats, long sleeves and use of sunscreen if there is significant sun exposure.   Diet: the importance of a healthy diet is discussed. She does have a healthy diet.  The benefits of regular aerobic exercise were discussed. She walks 4 times per week ,  20 minutes.   Depression screen: there are no signs or vegative symptoms of depression- irritability, change in appetite, anhedonia, sadness/tearfullness.  Cognitive assessment: the patient manages all their financial and personal affairs and is actively engaged. They could relate day,date,year and events; recalled 2/3 objects at 3 minutes; performed clock-face test normally.  The following portions of the patient's history were reviewed and updated as appropriate: allergies, current medications, past family history, past medical history,  past surgical history, past social  history  and problem list.  Visual acuity was not assessed per patient preference since she has regular follow up with her ophthalmologist. Hearing and body mass index were assessed and reviewed.   During the course of the visit the patient was educated and counseled about appropriate screening and preventive services including : fall prevention , diabetes screening, nutrition counseling, colorectal cancer screening, and recommended immunizations.    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 Diagnoses of Breast cancer screening, Vitamin D deficiency, Osteopenia of hip, unspecified laterality, Cervical lymphadenopathy, Fatigue, unspecified type, Encounter for general adult medical examination with abnormal findings, and Anxiety about health were also pertinent to this visit.  Enlarged cervical lymph node for the past 3 months on the left side.   Anxiety over health. Dicussed lexapro as a trial .  Both sisters had cancer (bone   And ovarian) and early AMI  In another sister who died.  History Jarrett has a past medical history of Allergy, Anxiety, and Hyperlipidemia.   She has a past surgical history that includes Mandible fracture surgery and Esophagogastroduodenoscopy (egd) with propofol (N/A, 11/25/2017).   Her family history includes COPD in her mother and sister; Cancer in her mother; Cancer (age of onset: 66) in her sister; Diabetes in her brother and mother; Early death in her father; Heart disease in her mother; Heart disease (age of onset: 51) in her father; Hyperlipidemia in her father and mother; Stroke in her maternal grandmother and sister.She reports that she has never smoked. She has never used smokeless tobacco. She reports that she  does not drink alcohol or use drugs.  Outpatient Medications Prior to Visit  Medication Sig Dispense Refill  . Cholecalciferol (VITAMIN D3) 1000 units CAPS Take 1 capsule by mouth daily.    Marland Kitchen omeprazole (PRILOSEC) 20 MG capsule      . vitamin B-12 (CYANOCOBALAMIN) 1000 MCG tablet Take 1,000 mcg by mouth daily.     No facility-administered medications prior to visit.     Review of Systems  Objective:  BP 96/64 (BP Location: Left Arm, Patient Position: Sitting, Cuff Size: Normal)   Pulse 87   Temp 98.2 F (36.8 C) (Oral)   Resp 15   Ht 5\' 4"  (1.626 m)   Wt 119 lb 12.8 oz (54.3 kg)   LMP 11/20/2012   SpO2 99%   BMI 20.56 kg/m   Physical Exam  General appearance: alert, cooperative and appears stated age Head: Normocephalic, without obvious abnormality, atraumatic Eyes: conjunctivae/corneas clear. PERRL, EOM's intact. Fundi benign. Ears: normal TM's and external ear canals both ears Nose: Nares normal. Septum midline. Mucosa normal. No drainage or sinus tenderness. Throat: lips, mucosa, and tongue normal; teeth and gums normal Neck: palpable non tender left anterior cervical chain node ,  no carotid bruit, no JVD, supple, symmetrical, trachea midline and thyroid not enlarged, symmetric, no tenderness/mass/nodules Lungs: clear to auscultation bilaterally Breasts: normal appearance, no masses or tenderness Heart: regular rate and rhythm, S1, S2 normal, no murmur, click, rub or gallop Abdomen: soft, non-tender; bowel sounds normal; no masses,  no organomegaly Extremities: extremities normal, atraumatic, no cyanosis or edema Pulses: 2+ and symmetric Skin: Skin color, texture, turgor normal. No rashes or lesions Neurologic: Alert and oriented X 3, normal strength and tone. Normal symmetric reflexes. Normal coordination and gait.     Assessment & Plan:   Problem List Items Addressed This Visit    Anxiety about health    She admits to having obsessive worries about her health , which is relatively excellent. She is agreeable to a trial of Lexapro  Starting with the 5 mg dose       Relevant Medications   escitalopram (LEXAPRO) 5 MG tablet   Cervical lymphadenopathy    Etiology unclear: reactive vs  pathologic.  Has been enlarged for 3 months.  Workup would include biopsy of lymph node vs imaging of neck chest abd and pelvis.  Referring to Dr Tami Ribas      Relevant Orders   CBC with Differential/Platelet (Completed)   Encounter for general adult medical examination with abnormal findings    age appropriate education and counseling updated, referrals for preventative services and immunizations addressed, dietary and smoking counseling addressed, most recent labs reviewed.  I have personally reviewed and have noted:  1) the patient's medical and social history 2) The pt's use of alcohol, tobacco, and illicit drugs 3) The patient's current medications and supplements 4) Functional ability including ADL's, fall risk, home safety risk, hearing and visual impairment 5) Diet and physical activities 6) Evidence for depression or mood disorder 7) The patient's height, weight, and BMI have been recorded in the chart  I have made referrals, and provided counseling and education based on review of the above      Osteopenia    She has moderate osteopenia by 2018 DEXA , , T Score was  -2.1  10 yr risk is 6% .  Recommend weight bearing exercise, working on upper body strength using low weights, 1200 mg calcium through diet /supplements and 1000 units Vit D daily  Repeat  DEXA is due         Vitamin D deficiency   Relevant Orders   VITAMIN D 25 Hydroxy (Vit-D Deficiency, Fractures) (Completed)    Other Visit Diagnoses    Osteopenia after menopause    -  Primary   Relevant Orders   DG Bone Density   Breast cancer screening       Relevant Orders   MM 3D SCREEN BREAST BILATERAL   Fatigue, unspecified type       Relevant Orders   Comprehensive metabolic panel (Completed)   TSH (Completed)      I am having Derek Jack start on escitalopram. I am also having her maintain her Vitamin D3, vitamin B-12, and omeprazole.  Meds ordered this encounter  Medications  . escitalopram (LEXAPRO)  5 MG tablet    Sig: Take 1 tablet (5 mg total) by mouth daily.    Dispense:  90 tablet    Refill:  0    There are no discontinued medications.  Follow-up: No follow-ups on file.   [name], MD

## 2018-09-04 LAB — COMPREHENSIVE METABOLIC PANEL
ALT: 13 U/L (ref 0–35)
AST: 14 U/L (ref 0–37)
Albumin: 4.1 g/dL (ref 3.5–5.2)
Alkaline Phosphatase: 85 U/L (ref 39–117)
BUN: 15 mg/dL (ref 6–23)
CO2: 30 mEq/L (ref 19–32)
Calcium: 9.4 mg/dL (ref 8.4–10.5)
Chloride: 103 mEq/L (ref 96–112)
Creatinine, Ser: 0.64 mg/dL (ref 0.40–1.20)
GFR: 94.18 mL/min (ref >60.00)
Glucose, Bld: 89 mg/dL (ref 70–99)
Potassium: 4.4 mEq/L (ref 3.5–5.1)
Sodium: 141 mEq/L (ref 135–145)
Total Bilirubin: 0.3 mg/dL (ref 0.2–1.2)
Total Protein: 6.9 g/dL (ref 6.0–8.3)

## 2018-09-04 LAB — CBC WITH DIFFERENTIAL/PLATELET
Basophils Absolute: 0.1 10*3/uL (ref 0.0–0.1)
Basophils Relative: 1.1 % (ref 0.0–3.0)
Eosinophils Absolute: 0 10*3/uL (ref 0.0–0.7)
Eosinophils Relative: 0.5 % (ref 0.0–5.0)
HCT: 38.4 % (ref 36.0–46.0)
Hemoglobin: 12.8 g/dL (ref 12.0–15.0)
Lymphocytes Relative: 24.5 % (ref 12.0–46.0)
Lymphs Abs: 1.7 10*3/uL (ref 0.7–4.0)
MCHC: 33.3 g/dL (ref 30.0–36.0)
MCV: 89.6 fl (ref 78.0–100.0)
Monocytes Absolute: 0.6 10*3/uL (ref 0.1–1.0)
Monocytes Relative: 9 % (ref 3.0–12.0)
Neutro Abs: 4.5 10*3/uL (ref 1.4–7.7)
Neutrophils Relative %: 64.9 % (ref 43.0–77.0)
Platelets: 249 10*3/uL (ref 150.0–400.0)
RBC: 4.29 Mil/uL (ref 3.87–5.11)
RDW: 13.4 % (ref 11.5–15.5)
WBC: 7 10*3/uL (ref 4.0–10.5)

## 2018-09-04 LAB — VITAMIN D 25 HYDROXY (VIT D DEFICIENCY, FRACTURES): VITD: 54.49 ng/mL (ref 30.00–100.00)

## 2018-09-04 LAB — TSH: TSH: 1.37 u[IU]/mL (ref 0.35–4.50)

## 2018-09-06 DIAGNOSIS — F418 Other specified anxiety disorders: Secondary | ICD-10-CM | POA: Insufficient documentation

## 2018-09-06 DIAGNOSIS — R4589 Other symptoms and signs involving emotional state: Secondary | ICD-10-CM | POA: Insufficient documentation

## 2018-09-06 NOTE — Assessment & Plan Note (Signed)
Etiology unclear: reactive vs pathologic.  Has been enlarged for 3 months.  Workup would include biopsy of lymph node vs imaging of neck chest abd and pelvis.  Referring to Dr Tami Ribas

## 2018-09-06 NOTE — Assessment & Plan Note (Signed)

## 2018-09-06 NOTE — Assessment & Plan Note (Signed)
She has moderate osteopenia by 2018 DEXA , , T Score was  -2.1  10 yr risk is 6% .  Recommend weight bearing exercise, working on upper body strength using low weights, 1200 mg calcium through diet /supplements and 1000 units Vit D daily  Repeat DEXA is due

## 2018-09-06 NOTE — Assessment & Plan Note (Addendum)
She admits to having obsessive worries about her health , which is relatively excellent. She is agreeable to a trial of Lexapro  Starting with the 5 mg dose

## 2018-09-08 DIAGNOSIS — R1310 Dysphagia, unspecified: Secondary | ICD-10-CM | POA: Diagnosis not present

## 2018-09-08 DIAGNOSIS — R221 Localized swelling, mass and lump, neck: Secondary | ICD-10-CM | POA: Diagnosis not present

## 2018-09-09 ENCOUNTER — Encounter: Payer: Self-pay | Admitting: Internal Medicine

## 2018-10-06 ENCOUNTER — Other Ambulatory Visit: Payer: Self-pay

## 2018-10-06 ENCOUNTER — Telehealth: Payer: Self-pay | Admitting: *Deleted

## 2018-10-06 ENCOUNTER — Ambulatory Visit (INDEPENDENT_AMBULATORY_CARE_PROVIDER_SITE_OTHER): Payer: 59 | Admitting: Family Medicine

## 2018-10-06 DIAGNOSIS — Z20822 Contact with and (suspected) exposure to covid-19: Secondary | ICD-10-CM

## 2018-10-06 DIAGNOSIS — R6889 Other general symptoms and signs: Secondary | ICD-10-CM | POA: Diagnosis not present

## 2018-10-06 DIAGNOSIS — J029 Acute pharyngitis, unspecified: Secondary | ICD-10-CM | POA: Diagnosis not present

## 2018-10-06 MED ORDER — AMOXICILLIN 500 MG PO CAPS: 500.0000 mg | ORAL_CAPSULE | Freq: Two times a day (BID) | ORAL | 0 refills | Status: DC

## 2018-10-06 NOTE — Telephone Encounter (Signed)
Copied from [place] [phone]. Topic: Appointment Scheduling - Scheduling Inquiry for Clinic >> Oct 06, 2018 11:30 AM Yvette Rack wrote: Reason for CRM: Pt stated she would like to schedule her 6 month follow up appt. Pt also stated that she is experiencing some sinus drainage and it is causing her throat to be sore. Pt stated after reviewing her [facility] account she noticed that the last time she had similar symptoms she had strep throat. Attempted to transfer pt to the office to schedule appt but there was no answer. Pt requests call back. Cb# [phone]

## 2018-10-06 NOTE — Telephone Encounter (Signed)
Scheduled Virtual with NP for 3:20 this afternoon. Sore throat , sinus drainage. FYI

## 2018-10-06 NOTE — Progress Notes (Signed)
Patient ID: [name] [name], female   DOB: [birth year], 63 y.o.   MRN: [id number]    Virtual Visit via video Note  This visit type was conducted due to national recommendations for restrictions regarding the COVID-19 pandemic (e.g. social distancing).  This format is felt to be most appropriate for this patient at this time.  All issues noted in this document were discussed and addressed.  No physical exam was performed (except for noted visual exam findings with Video Visits).   I connected with [name] [name] today at  3:20 PM EDT by a video enabled telemedicine application and verified that I am speaking with the correct person using two identifiers. Location patient: home Location provider: work or home office Persons participating in the virtual visit: patient, provider  I discussed the limitations, risks, security and privacy concerns of performing an evaluation and management service by video and the availability of in person appointments. I also discussed with the patient that there may be a patient responsible charge related to this service. The patient expressed understanding and agreed to proceed.  HPI:  Patient and I connected via video due to complaint of sore scratchy throat for almost 1 week.  Patient has been working from home and denies any known contact to someone who is under investigation for or who was tested positive for COVID-19.  Patient does go to the grocery store on occasion, but has been diligent with handwashing and wearing a mask.  Patient states her sore scratchy throat feels similar to when she had strep throat a few years back and is concerned this possibly could be the case.  Denies fever or chills.  Denies nausea, vomiting or diarrhea.  Denies cough, shortness breath or wheezing.  Denies body aches.  In addition to the sore throat, does feel as if some postnasal drainage is occurring which could potentially be contributing to the sore throat.  She has been doing  salt water gargles which has helped, sore throat somewhat.    ROS: See pertinent positives and negatives per HPI.  Past Medical History:  Diagnosis Date  . Allergy   . Anxiety   . Hyperlipidemia     Past Surgical History:  Procedure Laterality Date  . ESOPHAGOGASTRODUODENOSCOPY (EGD) WITH PROPOFOL N/A 11/25/2017   Procedure: ESOPHAGOGASTRODUODENOSCOPY (EGD) WITH PROPOFOL;  Surgeon: Manya Silvas, MD;  Location: [facility] ENDOSCOPY;  Service: Endoscopy;  Laterality: N/A;  . MANDIBLE FRACTURE SURGERY      Family History  Problem Relation Age of Onset  . Hyperlipidemia Mother   . Diabetes Mother   . COPD Mother   . Heart disease Mother        smoker  . Cancer Mother        lung CA mets   . Hyperlipidemia Father   . Heart disease Father 35       ami, smoker  . Early death Father   . Stroke Maternal Grandmother   . Stroke Sister   . COPD Sister   . Cancer Sister 20       lymphoma  . Diabetes Brother   . Breast cancer Neg Hx    Social History   Tobacco Use  . Smoking status: Never Smoker  . Smokeless tobacco: Never Used  Substance Use Topics  . Alcohol use: No     Current Outpatient Medications:  .  Cholecalciferol (VITAMIN D3) 1000 units CAPS, Take 1 capsule by mouth daily., Disp: , Rfl:  .  escitalopram (LEXAPRO) 5 MG tablet,  Take 1 tablet (5 mg total) by mouth daily., Disp: 90 tablet, Rfl: 0 .  omeprazole (PRILOSEC) 20 MG capsule, , Disp: , Rfl:  .  vitamin B-12 (CYANOCOBALAMIN) 1000 MCG tablet, Take 1,000 mcg by mouth daily., Disp: , Rfl:  .  amoxicillin (AMOXIL) 500 MG capsule, Take 1 capsule (500 mg total) by mouth 2 (two) times daily., Disp: 20 capsule, Rfl: 0  EXAM:  GENERAL: alert, oriented, appears well and in no acute distress  HEENT: atraumatic, conjunttiva clear, no obvious abnormalities on inspection of external nose and ears  NECK: normal movements of the head and neck  LUNGS: on inspection no signs of respiratory distress, breathing rate  appears normal, no obvious gross SOB, gasping or wheezing  CV: no obvious cyanosis  MS: moves all visible extremities without noticeable abnormality  PSYCH/NEURO: pleasant and cooperative, no obvious depression or anxiety, speech and thought processing grossly intact  ASSESSMENT AND PLAN:  Discussed the following assessment and plan:  Suspected COVID-19 virus infection, sore throat - advised that due to symptoms, we need to get patient set up for COVID-19 testing.  Patient advised that I will put order in and he can go to testing location for for drive-through testing. Patient given the address of testing site.  Patient advised that testing is taking 2 to 7 days to result, and while we are awaiting results patient must remain under self quarantine and monitor for any changing/worsening symptoms.  Advised over-the-counter medications such as Tylenol can be used to help treat pain or fevers, Robitussin can be used to help calm cough, allergy medication such as Claritin or Allegra can help reduce congestion.  Also discussed getting plenty of rest and increasing fluid intake.  Made patient aware that test results as well as how his symptoms progress will determine when the self quarantine will be able to end.  Also advised to monitor self for any worsening symptoms, advised if severe shortness of breath develops, high fever that is not reduced with use of Tylenol, chest pain, severe vomiting or diarrhea  --patient must call on-call and or go to ER right away for evaluation. patient verbalized understanding of these instructions.  Patient complaining patient has a smoker but 19, due to her sore throat.  Similar to when she had strep in the past we will cover her with amoxicillin twice daily for 10 days to treat possible strep throat infection.  Due to COVID-19 restrictions we are unable to bring patient into the clinic this time to swab her for strep.   I discussed the assessment and treatment plan with  the patient. The patient was provided an opportunity to ask questions and all were answered. The patient agreed with the plan and demonstrated an understanding of the instructions.   The patient was advised to call back or seek an in-person evaluation if the symptoms worsen or if the condition fails to improve as anticipated.   Jodelle Green, FNP

## 2018-10-07 ENCOUNTER — Encounter: Payer: Self-pay | Admitting: Family Medicine

## 2018-10-28 ENCOUNTER — Ambulatory Visit
Admission: RE | Admit: 2018-10-28 | Discharge: 2018-10-28 | Disposition: A | Discharge status: Home / Self Care | Payer: 59 | Source: Ambulatory Visit | Attending: Internal Medicine | Admitting: Internal Medicine

## 2018-10-28 DIAGNOSIS — Z1239 Encounter for other screening for malignant neoplasm of breast: Secondary | ICD-10-CM

## 2018-10-28 DIAGNOSIS — Z1231 Encounter for screening mammogram for malignant neoplasm of breast: Secondary | ICD-10-CM | POA: Diagnosis not present

## 2018-10-28 DIAGNOSIS — M8588 Other specified disorders of bone density and structure, other site: Secondary | ICD-10-CM | POA: Diagnosis not present

## 2018-10-28 DIAGNOSIS — M810 Age-related osteoporosis without current pathological fracture: Secondary | ICD-10-CM | POA: Insufficient documentation

## 2018-10-28 DIAGNOSIS — Z780 Asymptomatic menopausal state: Secondary | ICD-10-CM | POA: Diagnosis not present

## 2018-11-12 ENCOUNTER — Telehealth: Payer: 59 | Admitting: Internal Medicine

## 2018-11-12 ENCOUNTER — Other Ambulatory Visit: Payer: Self-pay

## 2018-11-12 ENCOUNTER — Telehealth (INDEPENDENT_AMBULATORY_CARE_PROVIDER_SITE_OTHER): Payer: 59 | Admitting: Internal Medicine

## 2018-11-12 ENCOUNTER — Encounter: Payer: Self-pay | Admitting: Internal Medicine

## 2018-11-12 DIAGNOSIS — M810 Age-related osteoporosis without current pathological fracture: Secondary | ICD-10-CM | POA: Diagnosis not present

## 2018-11-12 NOTE — Progress Notes (Signed)
Virtual Visit via Doxy.me  This visit type was conducted due to national recommendations for restrictions regarding the COVID-19 pandemic (e.g. social distancing).  This format is felt to be most appropriate for this patient at this time.  All issues noted in this document were discussed and addressed.  No physical exam was performed (except for noted visual exam findings with Video Visits).   I connected with@ on 11/12/18 at 10:00 AM EDT by a video enabled telemedicine application  and verified that I am speaking with the correct person using two identifiers. Location patient: home Location provider: work or home office Persons participating in the virtual visit: patient, provider  I discussed the limitations, risks, security and privacy concerns of performing an evaluation and management service by telephone and the availability of in person appointments. I also discussed with the patient that there may be a patient responsible charge related to this service. The patient expressed understanding and agreed to proceed.  Reason for visit: osteoporosis :  Diagnosis and treatment   HPI:  63 yr old female with chronic  BMI < 22 presents to discuss recent DEXA showing osteoporosis She has no history  of fractures,  And takes no preventive medications.. Diet,  Nutritional needs and exercise discussed .  ROS: Patient denies headache, fevers, malaise, unintentional weight loss, skin rash, eye pain, sinus congestion and sinus pain, sore throat, dysphagia,  hemoptysis , cough, dyspnea, wheezing, chest pain, palpitations, orthopnea, edema, abdominal pain, nausea, melena, diarrhea, constipation, flank pain, dysuria, hematuria, urinary  Frequency, nocturia, numbness, tingling, seizures,  Focal weakness, Loss of consciousness,  Tremor, insomnia, depression, anxiety, and suicidal ideation.     Past Medical History:  Diagnosis Date  . Allergy   . Anxiety   . Hyperlipidemia     Past Surgical History:   Procedure Laterality Date  . ESOPHAGOGASTRODUODENOSCOPY (EGD) WITH PROPOFOL N/A 11/25/2017   Procedure: ESOPHAGOGASTRODUODENOSCOPY (EGD) WITH PROPOFOL;  Surgeon: Manya Silvas, MD;  Location: [facility] ENDOSCOPY;  Service: Endoscopy;  Laterality: N/A;  . MANDIBLE FRACTURE SURGERY      Family History  Problem Relation Age of Onset  . Hyperlipidemia Mother   . Diabetes Mother   . COPD Mother   . Heart disease Mother        smoker  . Cancer Mother        lung CA mets   . Hyperlipidemia Father   . Heart disease Father 85       ami, smoker  . Early death Father   . Stroke Maternal Grandmother   . Stroke Sister   . COPD Sister   . Cancer Sister 64       lymphoma  . Diabetes Brother   . Breast cancer Neg Hx     SOCIAL HX:  reports that she has never smoked. She has never used smokeless tobacco. She reports that she does not drink alcohol or use drugs.   Current Outpatient Medications:  .  Cholecalciferol (VITAMIN D3) 1000 units CAPS, Take 1 capsule by mouth daily., Disp: , Rfl:  .  omeprazole (PRILOSEC) 20 MG capsule, , Disp: , Rfl:  .  vitamin B-12 (CYANOCOBALAMIN) 1000 MCG tablet, Take 1,000 mcg by mouth daily., Disp: , Rfl:  .  escitalopram (LEXAPRO) 5 MG tablet, Take 1 tablet (5 mg total) by mouth daily. (Patient not taking: Reported on 11/12/2018), Disp: 90 tablet, Rfl: 0  EXAM:  VITALS per patient if applicable:  GENERAL: alert, oriented, appears well and in no acute distress  HEENT: atraumatic, conjunttiva clear, no obvious abnormalities on inspection of external nose and ears  NECK: normal movements of the head and neck  LUNGS: on inspection no signs of respiratory distress, breathing rate appears normal, no obvious gross SOB, gasping or wheezing  CV: no obvious cyanosis  MS: moves all visible extremities without noticeable abnormality  PSYCH/NEURO: pleasant and cooperative, no obvious depression or anxiety, speech and thought processing grossly  intact  ASSESSMENT AND PLAN:  Discussed the following assessment and plan:  Age-related osteoporosis without current pathological fracture  Osteoporosis A total of 40 minutes was spent with patient more than half of which was spent in counseling patient on the various methods of treating osteoporosis reviewing and explaining recent labs and imaging studies done, and coordination of care.  Ultimately she has decided on Evista    I discussed the assessment and treatment plan with the patient. The patient was provided an opportunity to ask questions and all were answered. The patient agreed with the plan and demonstrated an understanding of the instructions.   The patient was advised to call back or seek an in-person evaluation if the symptoms worsen or if the condition fails to improve as anticipated.  I provided 40 minutes of non-face-to-face time during this encounter.   [name], MD

## 2018-11-12 NOTE — Patient Instructions (Signed)
I am recommending a trial of generic Evista for treatment of your osteoporosis.  Talk to your pharmacist about it and let me know. It can be taken any time of day with food  Your calcium and vitamin D needs:  1800 mg calcium daily 1000 Ius D3 daily (miniju,  2000 Ius is fine too)  Weight bearing exercise (adding 1-2 lb wrist/ankle weights) to your Brush and treadmill  Raloxifene tablets What is this medicine? RALOXIFENE (ral OX i feen) reduces the amount of calcium lost from bones. It is used to treat and prevent osteoporosis in women who have experienced menopause. It may also help prevent invasive breast cancer in certain women who have a high risk for breast cancer. This medicine may be used for other purposes; ask your health care provider or pharmacist if you have questions. COMMON BRAND NAME(S): Evista What should I tell my health care provider before I take this medicine? They need to know if you have any of these conditions:  a history of blood clots  cancer  heart disease or recent heart attack  high levels of triglycerides (blood fat) in the blood  history of stroke  kidney disease  liver disease  premenopausal  smoke tobacco  an unusual or allergic reaction to raloxifene, other medicines, foods, dyes, or preservatives  pregnant or trying to get pregnant  breast-feeding How should I use this medicine? Take this medicine by mouth with a glass of water. Follow the directions on the prescription label. The tablets can be taken with or without food. Take your doses at regular intervals. Do not take your medicine more often than directed. A special MedGuide will be given to you by the pharmacist with each prescription and refill. Be sure to read this information carefully each time. Talk to your pediatrician regarding the use of this medicine in children. Special care may be needed. Overdosage: If you think you have taken too much of this medicine contact a poison  control center or emergency room at once. NOTE: This medicine is only for you. Do not share this medicine with others. What if I miss a dose? If you miss a dose, take it as soon as you can. If it is almost time for your next dose, take only that dose. Do not take double or extra doses. What may interact with this medicine?  cholestyramine  female hormones, like estrogens  warfarin This list may not describe all possible interactions. Give your health care provider a list of all the medicines, herbs, non-prescription drugs, or dietary supplements you use. Also tell them if you smoke, drink alcohol, or use illegal drugs. Some items may interact with your medicine. What should I watch for while using this medicine? Visit your doctor or health care professional for regular checks on your progress. Do not stop taking this medicine except on the advice of your doctor or health care professional. If you are taking this medicine to reduce your risk of getting breast cancer, you should know that this medicine does not prevent all types of breast cancer. Talk to your doctor if you have questions. This medicine does not prevent hot flashes. It may cause hot flashes in some patients at the start of therapy. You should make sure that you get enough calcium and vitamin D while you are taking this medicine. Discuss the foods you eat and the vitamins you take with your health care professional. Exercise may help to prevent bone loss. Discuss your exercise needs with your  doctor or health care professional. This medicine can rarely cause blood clots. If you are going to have surgery, tell your doctor or health care professional that you are taking this medicine. This medicine should be stopped at least 3 days before surgery. After surgery, it should be restarted only after you are walking again. It should not be restarted while you still need long periods of bed rest. You should not smoke while taking this  medicine. Smoking may increase your risk of blood clots or stroke. If you have any reason to think you are pregnant; stop taking this medicine at once and contact your doctor or health care professional. Do not breast feed while taking this medicine. What side effects may I notice from receiving this medicine? Side effects that you should report to your doctor or health care professional as soon as possible:  allergic reactions like skin rash, itching or hives, swelling of the face, lips, or tongue)  breast tissue changes or discharge  signs and symptoms of a blood clot such as breathing problems; changes in vision; chest pain; severe, sudden headache; pain, swelling, warmth in the leg; trouble speaking; sudden numbness or weakness of the face, arm or leg  signs and symptoms of a stroke like changes in vision; confusion; trouble speaking or understanding; severe headaches; sudden numbness or weakness of the face, arm or leg; trouble walking; dizziness; loss of balance or coordination  vaginal discharge that is bloody, brown, or rust Side effects that usually do not require medical attention (report to your doctor or health care professional if they continue or are bothersome):  hot flashes  joint pain  leg cramps  sweating  swelling of the ankles, feet, hands This list may not describe all possible side effects. Call your doctor for medical advice about side effects. You may report side effects to FDA at 1-800-FDA-1088. Where should I keep my medicine? Keep out of the reach of children. Store at room temperature between 15 and 30 degrees C (59 and 86 degrees F). Throw away any unused medicine after the expiration date. NOTE: This sheet is a summary. It may not cover all possible information. If you have questions about this medicine, talk to your doctor, pharmacist, or health care provider.  2020 Elsevier/Gold Standard (2016-04-11 17:15:34)    Calcium Content in Foods Calcium is  the most abundant mineral in your body. Most of your body's calcium supply is stored in your bones and teeth. Calcium helps many parts of the body function normally, including:  Blood and blood vessels.  Nerves.  Hormones.  Muscles.  Bones and teeth. When your calcium stores are low, you may be at risk for low bone mass, bone loss, and broken bones (fractures). When you get enough calcium, it helps to support strong bones and teeth throughout your life. Calcium is especially important for:  Children during growth spurts.  Girls during adolescence.  Women who are pregnant or breastfeeding.  Women after their menstrual cycle stops (postmenopause).  Women whose menstrual cycle has stopped due to anorexia nervosa or regular intense exercise.  People who cannot eat or digest dairy products.  Vegans. What are tips for getting more calcium? General information  Try to get most of your calcium from food. Eat foods that are high in calcium.  Some people may benefit from taking calcium supplements. Check with your health care provider or diet and nutrition specialist (dietitian) before starting any calcium supplements. Calcium supplements may interact with certain medicines. Too much  calcium may cause other health problems, like constipation and kidney stones.  For the body to absorb calcium, it needs vitamin D. Sources of vitamin D include: ? Skin exposure to direct sunlight. ? Foods, such as egg yolks, liver, saltwater fish, and fortified milk. ? Vitamin D supplements. Check with your health care provider or dietitian before starting any vitamin D supplements. What foods are high in calcium?  High-calcium foods are those that contain more than 100 milligrams (mg) of calcium per serving. Fruits  Fortified orange or other fruit juice, 300 mg per 8 oz serving. Vegetables  Collard greens, 360 mg per 8 oz serving.  Kale, 180 mg per 8 oz serving.  Bok choy, 160 mg per 8 oz  serving. Grains  Fortified ready-to-eat cereals, 100-1,000 mg per 8 oz serving.  Fortified frozen waffles, 200 mg in two waffles. Meats and other proteins  Sardines, canned with bones, 325 mg per 3 oz serving.  Salmon, canned with bones, 180 mg per 3 oz serving.  Canned shrimp, 125 mg per 3 oz serving.  Baked beans, 160 mg per 4 oz serving. Dairy  Yogurt, plain, low-fat, 310 mg per 6 oz serving.  Milk, 300 mg per 8 oz serving.  American cheese, 195 mg per 1 oz serving.  Cheddar cheese, 205 mg per 1 oz serving.  Cottage cheese 2%, 105 mg per 4 oz serving.  Fortified soy, rice, or almond milk, 300 mg per 8 oz serving. The items listed above may not be a complete list of foods high in calcium. Actual amounts of calcium may be different depending on processing. Contact a dietitian for more information. What foods are lower in calcium? Foods lower in calcium are those that contain 50 mg of calcium or less per serving. Fruits  Apple, about 6 mg in one apple.  Banana, about 12 mg in one banana. Vegetables  Lettuce, 19 mg per 2 oz serving.  Tomato, about 11 mg in one tomato. Grains  Rice, 4 mg per 6 oz serving.  Boiled potatoes, 14 mg per 8 oz serving.  White bread, 6 mg in one slice. Meats and other proteins  Egg, 27 mg per 2 oz serving.  Red meat, 7 mg per 4 oz serving.  Chicken, 17 mg per 4 oz serving.  Fish, cod or trout, 20 mg per 4 oz serving. The items listed above may not be a complete list of foods lower in calcium. Actual amounts of calcium may be different depending on processing. Contact a dietitian for more information. Summary  Calcium is an important mineral in the body because it affects many functions. Getting enough calcium helps support strong bones and teeth throughout your life.  Try to get most of your calcium from food.  Calcium supplements may interact with certain medicines. Check with your health care provider before starting any  calcium supplements. This information is not intended to replace advice given to you by your health care provider. Make sure you discuss any questions you have with your health care provider. Document Released: 10/18/2003 Document Revised: 02/26/2017 Document Reviewed: 02/26/2017 Elsevier Patient Education  2020 [employer].

## 2018-11-15 NOTE — Assessment & Plan Note (Signed)
A total of 40 minutes was spent with patient more than half of which was spent in counseling patient on the various methods of treating osteoporosis reviewing and explaining recent labs and imaging studies done, and coordination of care.  Ultimately she has decided on Evista

## 2018-11-17 DIAGNOSIS — R1312 Dysphagia, oropharyngeal phase: Secondary | ICD-10-CM | POA: Diagnosis not present

## 2018-11-17 DIAGNOSIS — R110 Nausea: Secondary | ICD-10-CM | POA: Diagnosis not present

## 2018-11-17 DIAGNOSIS — K210 Gastro-esophageal reflux disease with esophagitis: Secondary | ICD-10-CM | POA: Diagnosis not present

## 2018-11-17 DIAGNOSIS — K2970 Gastritis, unspecified, without bleeding: Secondary | ICD-10-CM | POA: Diagnosis not present

## 2018-12-08 ENCOUNTER — Encounter: Payer: 59 | Attending: Internal Medicine | Admitting: Dietician

## 2018-12-08 ENCOUNTER — Other Ambulatory Visit: Payer: Self-pay

## 2018-12-08 VITALS — Ht 64.0 in | Wt 120.7 lb

## 2018-12-08 DIAGNOSIS — N810 Urethrocele: Secondary | ICD-10-CM | POA: Diagnosis not present

## 2018-12-08 DIAGNOSIS — M810 Age-related osteoporosis without current pathological fracture: Secondary | ICD-10-CM

## 2018-12-08 DIAGNOSIS — Z713 Dietary counseling and surveillance: Secondary | ICD-10-CM | POA: Diagnosis not present

## 2018-12-08 NOTE — Progress Notes (Signed)
[employer] Employee "self referral" nutrition session: Start time: 1100   End time: 1210  Height: 5'4" Weight: 120lbs  Met with employee to discuss his/her nutritional concerns and diet history.   Diet history/ progress:   Has been diagnosed with osteoporosis recently, given goal for calcium intake of 1865m daily.   Takes Doterra 2 caps daily, plans to increase to 3 per day. She plans to begin osteoporosis medication prescribed by MD.   Has been researching sources of calcium and tracking her daily intake; averaging 1750-1801mmost days, including calcium content of all her foods.   She has recently begun some regular exercise, and plans to continue to increase frequency and/or duration.   She reports weight loss several months ago when struggling with nausea; she has begun to gain some weight back.   Typical eating pattern: Breakfast: oatmeal (organic) takes with calcium supplement Snack: homemade smoothie with protein powder, almond milk and/or greek yogurt, fruit, and recently kale Lunch: 1-2pm salad with chicken and cheese (mozzarella or feta) with Ranch made with GrMayotteogurt 3037ma; sandwich with chicken and provolone on pita with oat/ flax 140m25m or white wheat bread (380mg32m Snack: same as evening Supper: chicken often ie parmesan coated or GreekMayottert/ casserole/ pie; fajitas/ tacos with ground turkeKuwait roast meat + green beans sauteed, broccoli, potatoes Snack: cheesecake made with GreekMayottert and 4 eggs; skinny banana pudding with GreekMayottert; graham crackers with milk; yasso frozen yogurt --100-150mg 11meverages: water, other sugar free beverages   Education topics covered during this visit:  Other Medical Conditions: osteoporosis  Educational resources provided:  Osteoporosis Nutrition Therapy (NCM)   Additional Comments:  Patient has worked diligently to achieve adequate calcium intake for her bone health.   She will continue to include  calcium-rich food sources and take additional supplement to reach her daily goal, keeping to 500mg a77mtime, while avoiding excessive intake above 2500mg.  73mcussed other food sources of calcium; goals for vitamin D intake and safe upper limit.    Plan: Continue with current eating pattern that includes calcium-rich foods and sources of vitamin D.

## 2018-12-17 ENCOUNTER — Ambulatory Visit: Payer: 59 | Admitting: Dietician

## 2018-12-17 DIAGNOSIS — R1310 Dysphagia, unspecified: Secondary | ICD-10-CM | POA: Diagnosis not present

## 2018-12-17 DIAGNOSIS — R221 Localized swelling, mass and lump, neck: Secondary | ICD-10-CM | POA: Diagnosis not present

## 2018-12-23 ENCOUNTER — Encounter: Payer: Self-pay | Admitting: Internal Medicine

## 2018-12-23 ENCOUNTER — Other Ambulatory Visit: Payer: Self-pay

## 2018-12-23 ENCOUNTER — Ambulatory Visit (INDEPENDENT_AMBULATORY_CARE_PROVIDER_SITE_OTHER): Payer: 59 | Admitting: Internal Medicine

## 2018-12-23 VITALS — Ht 64.0 in | Wt 120.7 lb

## 2018-12-23 DIAGNOSIS — M810 Age-related osteoporosis without current pathological fracture: Secondary | ICD-10-CM | POA: Diagnosis not present

## 2018-12-23 DIAGNOSIS — F418 Other specified anxiety disorders: Secondary | ICD-10-CM | POA: Diagnosis not present

## 2018-12-23 DIAGNOSIS — E782 Mixed hyperlipidemia: Secondary | ICD-10-CM

## 2018-12-23 NOTE — Progress Notes (Signed)
Virtual Visit via [email or web address]  This visit type was conducted due to national recommendations for restrictions regarding the COVID-19 pandemic (e.g. social distancing).  This format is felt to be most appropriate for this patient at this time.  All issues noted in this document were discussed and addressed.  No physical exam was performed (except for noted visual exam findings with Video Visits).   I connected with@ on 12/23/18 at  4:00 PM EDT by a video enabled telemedicine application and verified that I am speaking with the correct person using two identifiers. Location patient: home Location provider: work or home office Persons participating in the virtual visit: patient, provider  I discussed the limitations, risks, security and privacy concerns of performing an evaluation and management service by telephone and the availability of in person appointments. I also discussed with the patient that there may be a patient responsible charge related to this service. The patient expressed understanding and agreed to proceed.  Reason for visit: follow up on osteoporosis  Anxiety over health  HPI:  63 yr old female with osteoporosis  And anxiety about her health presents for discussion of treatment alternatives.  AD:  She never started the lexapro that was recommended at her last visit.  She feels less anxious about her health issues.  She continues to have reflux symptoms  and trouble swallowing pills  She has no history of falls or fractures.  She is taking 750 mg calcium supplements most days but states she occasionally misses a dose.    I provided  25 minutes of non-face-to-face time during this encounter reviewing patient's current problems and post surgeries.  Providing counseling on the above mentioned problems , and coordination  of care .   ROS: See pertinent positives and negatives per HPI.  Past Medical History:  Diagnosis Date  . Allergy   . Anxiety   . Hyperlipidemia     Past  Surgical History:  Procedure Laterality Date  . ESOPHAGOGASTRODUODENOSCOPY (EGD) WITH PROPOFOL N/A 11/25/2017   Procedure: ESOPHAGOGASTRODUODENOSCOPY (EGD) WITH PROPOFOL;  Surgeon: Manya Silvas, MD;  Location: [facility] ENDOSCOPY;  Service: Endoscopy;  Laterality: N/A;  . MANDIBLE FRACTURE SURGERY      Family History  Problem Relation Age of Onset  . Hyperlipidemia Mother   . Diabetes Mother   . COPD Mother   . Heart disease Mother        smoker  . Cancer Mother        lung CA mets   . Hyperlipidemia Father   . Heart disease Father 37       ami, smoker  . Early death Father   . Stroke Maternal Grandmother   . Stroke Sister   . COPD Sister   . Cancer Sister 50       lymphoma  . Diabetes Brother   . Breast cancer Neg Hx     SOCIAL HX:  reports that she has never smoked. She has never used smokeless tobacco. She reports that she does not drink alcohol or use drugs.   Current Outpatient Medications:  .  Calcium Carb-Cholecalciferol (CALCIUM 500 + D) 500-200 MG-UNIT TABS, Take by mouth. Taking Doterra supplement; 2 tabs in am, will start 1 tab in pm for total of 750mg  Calcium daily, Disp: , Rfl:  .  Cholecalciferol (VITAMIN D3) 1000 units CAPS, Take 1 capsule by mouth daily., Disp: , Rfl:  .  omeprazole (PRILOSEC) 20 MG capsule, Take 20 mg by mouth every other day. , Disp: ,  Rfl:  .  vitamin B-12 (CYANOCOBALAMIN) 1000 MCG tablet, Take 1,000 mcg by mouth daily., Disp: , Rfl:  .  escitalopram (LEXAPRO) 5 MG tablet, Take 1 tablet (5 mg total) by mouth daily. (Patient not taking: Reported on 12/23/2018), Disp: 90 tablet, Rfl: 0 .  raloxifene (EVISTA) 60 MG tablet, Take 1 tablet (60 mg total) by mouth daily., Disp: 30 tablet, Rfl: 5  EXAM:  VITALS per patient if applicable:  GENERAL: alert, oriented, appears well and in no acute distress  HEENT: atraumatic, conjunttiva clear, no obvious abnormalities on inspection of external nose and ears  NECK: normal movements of the head and  neck  LUNGS: on inspection no signs of respiratory distress, breathing rate appears normal, no obvious gross SOB, gasping or wheezing  CV: no obvious cyanosis  MS: moves all visible extremities without noticeable abnormality  PSYCH/NEURO: pleasant and cooperative, no obvious depression or anxiety, speech and thought processing grossly intact  ASSESSMENT AND PLAN:  Discussed the following assessment and plan:  Moderate mixed hyperlipidemia not requiring statin therapy - Plan: Lipid panel, Comprehensive metabolic panel  Age-related osteoporosis without current pathological fracture  Anxiety about health  Osteoporosis Risks and benefits of available treatments discussed.  Trial of evista.   Anxiety about health Encouraged to consider lexapro trial given her anxiety preventing her from trying medication    I discussed the assessment and treatment plan with the patient. The patient was provided an opportunity to ask questions and all were answered. The patient agreed with the plan and demonstrated an understanding of the instructions.   The patient was advised to call back or seek an in-person evaluation if the symptoms worsen or if the condition fails to improve as anticipated.   I provided  25 minutes of non-face-to-face time during this encounter reviewing patient's current problems and post surgeries.  Providing counseling on the above mentioned problems , and coordination  of care . [name] Mc, MD

## 2018-12-24 MED ORDER — RALOXIFENE HCL 60 MG PO TABS: 60.0000 mg | ORAL_TABLET | Freq: Every day | ORAL | 5 refills | Status: DC

## 2018-12-24 NOTE — Assessment & Plan Note (Signed)
Risks and benefits of available treatments discussed.  Trial of evista.

## 2018-12-24 NOTE — Assessment & Plan Note (Signed)
Encouraged to consider lexapro trial given her anxiety preventing her from trying medication

## 2018-12-25 ENCOUNTER — Other Ambulatory Visit (INDEPENDENT_AMBULATORY_CARE_PROVIDER_SITE_OTHER): Payer: 59

## 2018-12-25 ENCOUNTER — Other Ambulatory Visit: Payer: Self-pay

## 2018-12-25 DIAGNOSIS — E782 Mixed hyperlipidemia: Secondary | ICD-10-CM | POA: Diagnosis not present

## 2018-12-25 LAB — LIPID PANEL
Cholesterol: 160 mg/dL (ref 0–200)
HDL: 65.2 mg/dL (ref >39.00)
LDL Cholesterol: 76 mg/dL (ref 0–99)
NonHDL: 94.32
Total CHOL/HDL Ratio: 2
Triglycerides: 90 mg/dL (ref 0.0–149.0)
VLDL: 18 mg/dL (ref 0.0–40.0)

## 2018-12-25 LAB — COMPREHENSIVE METABOLIC PANEL
ALT: 12 U/L (ref 0–35)
AST: 16 U/L (ref 0–37)
Albumin: 4 g/dL (ref 3.5–5.2)
Alkaline Phosphatase: 72 U/L (ref 39–117)
BUN: 17 mg/dL (ref 6–23)
CO2: 31 mEq/L (ref 19–32)
Calcium: 9.2 mg/dL (ref 8.4–10.5)
Chloride: 101 mEq/L (ref 96–112)
Creatinine, Ser: 0.59 mg/dL (ref 0.40–1.20)
GFR: 103.35 mL/min (ref >60.00)
Glucose, Bld: 91 mg/dL (ref 70–99)
Potassium: 3.9 mEq/L (ref 3.5–5.1)
Sodium: 139 mEq/L (ref 135–145)
Total Bilirubin: 0.4 mg/dL (ref 0.2–1.2)
Total Protein: 7 g/dL (ref 6.0–8.3)

## 2019-03-04 DIAGNOSIS — H524 Presbyopia: Secondary | ICD-10-CM | POA: Diagnosis not present

## 2019-03-06 ENCOUNTER — Encounter: Payer: Self-pay | Admitting: Internal Medicine

## 2019-03-06 ENCOUNTER — Other Ambulatory Visit: Payer: Self-pay

## 2019-03-06 ENCOUNTER — Ambulatory Visit (INDEPENDENT_AMBULATORY_CARE_PROVIDER_SITE_OTHER): Payer: 59 | Admitting: Internal Medicine

## 2019-03-06 VITALS — Ht 64.0 in | Wt 120.0 lb

## 2019-03-06 DIAGNOSIS — F418 Other specified anxiety disorders: Secondary | ICD-10-CM

## 2019-03-06 DIAGNOSIS — E785 Hyperlipidemia, unspecified: Secondary | ICD-10-CM | POA: Diagnosis not present

## 2019-03-06 DIAGNOSIS — M810 Age-related osteoporosis without current pathological fracture: Secondary | ICD-10-CM | POA: Diagnosis not present

## 2019-03-06 DIAGNOSIS — E559 Vitamin D deficiency, unspecified: Secondary | ICD-10-CM

## 2019-03-06 DIAGNOSIS — R5383 Other fatigue: Secondary | ICD-10-CM

## 2019-03-06 DIAGNOSIS — R590 Localized enlarged lymph nodes: Secondary | ICD-10-CM | POA: Diagnosis not present

## 2019-03-06 NOTE — Assessment & Plan Note (Signed)
She has been evaluated by Anda Latina  x 2 and has follow up in a few weeks

## 2019-03-06 NOTE — Progress Notes (Signed)
Virtual Visit via [email or web address]  This visit type was conducted due to national recommendations for restrictions regarding the COVID-19 pandemic (e.g. social distancing).  This format is felt to be most appropriate for this patient at this time.  All issues noted in this document were discussed and addressed.  No physical exam was performed (except for noted visual exam findings with Video Visits).   I connected with@ on 03/06/19 at  1:30 PM EST by a video enabled telemedicine application and verified that I am speaking with the correct person using two identifiers. Location patient: home Location provider: work or home office Persons participating in the virtual visit: patient, provider  I discussed the limitations, risks, security and privacy concerns of performing an evaluation and management service by telephone and the availability of in person appointments. I also discussed with the patient that there may be a patient responsible charge related to this service. The patient expressed understanding and agreed to proceed.  Reason for visit: follow up on osteoporosis,  Anxiety   HPI:  63 yr old female diagnosed with osteoporosis recently by follow up DEXA , recently started on Evista .  She is tolerating the medication without side effects.   She remains very concerned about obtaining the correct amount of calcium and vitamin D and spent 10 minutes describing how she was planning to do so.     ROS: See pertinent positives and negatives per HPI. n w Past Medical History:  Diagnosis Date  . Allergy   . Anxiety   . Hyperlipidemia     Past Surgical History:  Procedure Laterality Date  . ESOPHAGOGASTRODUODENOSCOPY (EGD) WITH PROPOFOL N/A 11/25/2017   Procedure: ESOPHAGOGASTRODUODENOSCOPY (EGD) WITH PROPOFOL;  Surgeon: Manya Silvas, MD;  Location: [facility] ENDOSCOPY;  Service: Endoscopy;  Laterality: N/A;  . MANDIBLE FRACTURE SURGERY      Family History  Problem Relation Age of Onset  .  Hyperlipidemia Mother   . Diabetes Mother   . COPD Mother   . Heart disease Mother        smoker  . Cancer Mother        lung CA mets   . Hyperlipidemia Father   . Heart disease Father 66       ami, smoker  . Early death Father   . Stroke Maternal Grandmother   . Stroke Sister   . COPD Sister   . Cancer Sister 44       lymphoma  . Diabetes Brother   . Breast cancer Neg Hx     SOCIAL HX:  reports that she has never smoked. She has never used smokeless tobacco. She reports that she does not drink alcohol or use drugs.   Current Outpatient Medications:  .  Calcium Carb-Cholecalciferol (CALCIUM 500 + D) 500-200 MG-UNIT TABS, Take by mouth. Taking Doterra supplement; 2 tabs in am, will start 1 tab in pm for total of 750mg  Calcium daily, Disp: , Rfl:  .  Cholecalciferol (VITAMIN D3) 1000 units CAPS, Take 1 capsule by mouth daily., Disp: , Rfl:  .  omeprazole (PRILOSEC) 20 MG capsule, Take 20 mg by mouth every other day. , Disp: , Rfl:  .  raloxifene (EVISTA) 60 MG tablet, Take 1 tablet (60 mg total) by mouth daily., Disp: 30 tablet, Rfl: 5 .  vitamin B-12 (CYANOCOBALAMIN) 1000 MCG tablet, Take 1,000 mcg by mouth daily., Disp: , Rfl:  .  escitalopram (LEXAPRO) 5 MG tablet, Take 1 tablet (5 mg total) by mouth daily. (Patient not  taking: Reported on 12/23/2018), Disp: 90 tablet, Rfl: 0  EXAM:  VITALS per patient if applicable:  GENERAL: alert, oriented, appears well and in no acute distress  HEENT: atraumatic, conjunttiva clear, no obvious abnormalities on inspection of external nose and ears  NECK: normal movements of the head and neck  LUNGS: on inspection no signs of respiratory distress, breathing rate appears normal, no obvious gross SOB, gasping or wheezing  CV: no obvious cyanosis  MS: moves all visible extremities without noticeable abnormality  PSYCH/NEURO: pleasant and cooperative, no obvious depression or anxiety, speech and thought processing grossly  intact  ASSESSMENT AND PLAN:  Discussed the following assessment and plan:  Vitamin D deficiency - Plan: Vitamin D 25 hydroxy  Hyperlipidemia LDL goal <100 - Plan: Lipid panel  Fatigue, unspecified type - Plan: Comprehensive metabolic panel  Anxiety about health  Cervical lymphadenopathy  Age-related osteoporosis without current pathological fracture  Anxiety about health She denies any overt symptoms of anxiety but remains quite concerned about her daily intake of vitamin D and calcium, ,  records all food intake daily .  Encouraged to find an easy supplement to take twice daily and stop counting calcium milligrams.  Defers use of lexapro   Cervical lymphadenopathy She has been evaluated by Anda Latina  x 2 and has follow up in a few weeks  Osteoporosis She is tolerating Evista,  Has been exercising daily and has increased her calcium and vitamin D intake     I discussed the assessment and treatment plan with the patient. The patient was provided an opportunity to ask questions and all were answered. The patient agreed with the plan and demonstrated an understanding of the instructions.   The patient was advised to call back or seek an in-person evaluation if the symptoms worsen or if the condition fails to improve as anticipated. A total of 25 minutes of face to face time was spent with patient more than half of which was spent in counselling about the above mentioned conditions  and coordination of care  [name], MD

## 2019-03-06 NOTE — Assessment & Plan Note (Signed)
She denies any overt symptoms of anxiety but remains quite concerned about her daily intake of vitamin D and calcium, ,  records all food intake daily .  Encouraged to find an easy supplement to take twice daily and stop counting calcium milligrams.  Defers use of lexapro

## 2019-03-06 NOTE — Assessment & Plan Note (Addendum)
She is tolerating Evista,  Has been exercising daily and has increased her calcium and vitamin D intake

## 2019-06-05 DIAGNOSIS — R070 Pain in throat: Secondary | ICD-10-CM | POA: Diagnosis not present

## 2019-06-05 DIAGNOSIS — R1310 Dysphagia, unspecified: Secondary | ICD-10-CM | POA: Diagnosis not present

## 2019-06-08 ENCOUNTER — Other Ambulatory Visit: Payer: Self-pay | Admitting: Unknown Physician Specialty

## 2019-06-08 DIAGNOSIS — K222 Esophageal obstruction: Secondary | ICD-10-CM

## 2019-06-08 DIAGNOSIS — R1312 Dysphagia, oropharyngeal phase: Secondary | ICD-10-CM

## 2019-06-09 DIAGNOSIS — H18822 Corneal disorder due to contact lens, left eye: Secondary | ICD-10-CM | POA: Diagnosis not present

## 2019-06-18 ENCOUNTER — Ambulatory Visit
Admission: RE | Admit: 2019-06-18 | Discharge: 2019-06-18 | Disposition: A | Discharge status: Home / Self Care | Payer: 59 | Source: Ambulatory Visit | Attending: Unknown Physician Specialty | Admitting: Unknown Physician Specialty

## 2019-06-18 ENCOUNTER — Other Ambulatory Visit: Payer: Self-pay

## 2019-06-18 DIAGNOSIS — K222 Esophageal obstruction: Secondary | ICD-10-CM | POA: Diagnosis not present

## 2019-06-18 DIAGNOSIS — R1312 Dysphagia, oropharyngeal phase: Secondary | ICD-10-CM | POA: Insufficient documentation

## 2019-06-18 NOTE — Therapy (Signed)
[facility] [place], [place], [address] Phone: [phone]   Fax:     Modified Barium Swallow  Patient Details  Name: [name] [name] MRN: [id number] Date of Birth: 13-Mar-1956 No data recorded  Encounter Date: 06/18/2019  End of Session - 06/18/19 1431    Visit Number  1    Number of Visits  1    Date for SLP Re-Evaluation  06/18/19       Past Medical History:  Diagnosis Date  . Allergy   . Anxiety   . Hyperlipidemia     Past Surgical History:  Procedure Laterality Date  . ESOPHAGOGASTRODUODENOSCOPY (EGD) WITH PROPOFOL N/A 11/25/2017   Procedure: ESOPHAGOGASTRODUODENOSCOPY (EGD) WITH PROPOFOL;  Surgeon: Manya Silvas, MD;  Location: [facility] ENDOSCOPY;  Service: Endoscopy;  Laterality: N/A;  . MANDIBLE FRACTURE SURGERY      There were no vitals filed for this visit.      Subjective: Patient behavior: (alertness, ability to follow instructions, etc.): The patient is alert, able to verbalize her complaints, and follow directions.  She appears to be anxious.  Chief complaint: "feels like pills are getting hung"   Objective:  Radiological Procedure: A videoflouroscopic evaluation of oral-preparatory, reflex initiation, and pharyngeal phases of the swallow was performed; as well as a screening of the upper esophageal phase.  I. POSTURE: Upright in MBS chair and standing  II. VIEW: Lateral and A-P  III. COMPENSATORY STRATEGIES: Follow up swallows to clear barium tablet  IV. BOLUSES ADMINISTERED:   Thin Liquid: 1 small, 3 rapid consecutive   Nectar-thick Liquid: 2 moderate   Honey-thick Liquid: DNT   Puree: 3 teaspoon   Mechanical Soft: 1/4 graham cracker in applesauce   Barium tablet: 2  V. RESULTS OF EVALUATION: A. ORAL PREPARATORY PHASE: (The lips, tongue, and velum are observed for strength and coordination)       **Overall Severity Rating: within normal limits   B. SWALLOW  INITIATION/REFLEX: (The reflex is normal if "triggered" by the time the bolus reached the base of the tongue)  **Overall Severity Rating: Mild; triggers while falling from the valleculae to the pyriform sinuses  C. PHARYNGEAL PHASE: (Pharyngeal function is normal if the bolus shows rapid, smooth, and continuous transit through the pharynx and there is no pharyngeal residue after the swallow)  **Overall Severity Rating: within normal limits   D. LARYNGEAL PENETRATION: (Material entering into the laryngeal inlet/vestibule but not aspirated) None  E. ASPIRATION: None  F. ESOPHAGEAL PHASE: (Screening of the upper esophagus)  Barium tablet observed to lodge within the cervical esophagus and in the distal esophagus  ASSESSMENT: This 64 year old woman; with sensation of pills getting stuck; is presenting with mild oropharyngeal dysphagia characterized by delayed pharyngeal swallow initiation.  Oral control of the bolus including oral hold, rotary mastication, and anterior to posterior transfer is within functional limits.   Aspects of the pharyngeal stage of swallowing including tongue base retraction, hyolaryngeal excursion, epiglottic inversion, and duration/amplitude of UES opening are within normal limits.  There is no observed pharyngeal residue, laryngeal penetration, or tracheal aspiration.  The patient is not at risk for prandial aspiration.  The patient was given a barium tablet to take with water.  This passed promptly through the pharynx and the cervical esophagus.  A barium tablet taken in applesauce paused briefly in the valleculae but cleared the pharynx with the follow up swallow.  This tablet lodged in the cervical esophagus.  Upon standing for an  A-P view, the tablet was still in the distal esophagus and moved into the stomach with a swallow of applesauce.  The tablet did not appear to lodge due to narrowing of the esophagus, suggestive of esophageal dysmotility.  The patient was assured that  the tablet is getting into her stomach with follow up swallows despite the lingering sensation of the tablet in her esophagus.    PLAN/RECOMMENDATIONS:   A. Diet: Regular soften and moisten as needed for easier swallowing   B. Swallowing Precautions: Take plenty of fluids with medication   C. Recommended consultation to: follow up with MDs as recommended    D. Therapy recommendations: speech therapy is not indicated   E. Results and recommendations were discussed with the patient immediately following the study and the final report routed to the referring MD.   Oropharyngeal dysphagia - Plan: DG SWALLOW FUNC SPEECH PATH, DG SWALLOW FUNC SPEECH PATH  Esophageal stricture - Plan: DG SWALLOW [place], DG SWALLOW FUNC SPEECH PATH        Problem List Patient Active Problem List   Diagnosis Date Noted  . Anxiety about health 09/06/2018  . Cervical lymphadenopathy 09/02/2018  . Viral URI with cough 03/01/2018  . Vitamin D deficiency 01/16/2017  . Varicose veins of both lower extremities 07/20/2015  . Urethral polyp 05/23/2014  . Osteoporosis 09/30/2013  . Menopause present, declines hormone replacement therapy 04/15/2012  . Allergic rhinitis due to dust 04/14/2012  . Encounter for general adult medical examination with abnormal findings 07/17/2011  . GERD (gastroesophageal reflux disease) 06/07/2011   [name], MS/CCC- SLP  [name] 06/18/2019, 2:31 PM  [facility] [place], [place], [address] Phone: [phone]   Fax:     Name: [name] [name] MRN: [id number] Date of Birth: Nov 06, 1955

## 2019-06-22 ENCOUNTER — Other Ambulatory Visit: Payer: Self-pay | Admitting: Internal Medicine

## 2019-07-07 ENCOUNTER — Other Ambulatory Visit: Payer: Self-pay

## 2019-07-07 ENCOUNTER — Other Ambulatory Visit (INDEPENDENT_AMBULATORY_CARE_PROVIDER_SITE_OTHER): Payer: 59

## 2019-07-07 DIAGNOSIS — E785 Hyperlipidemia, unspecified: Secondary | ICD-10-CM | POA: Diagnosis not present

## 2019-07-07 DIAGNOSIS — E559 Vitamin D deficiency, unspecified: Secondary | ICD-10-CM

## 2019-07-07 DIAGNOSIS — R5383 Other fatigue: Secondary | ICD-10-CM

## 2019-07-07 LAB — COMPREHENSIVE METABOLIC PANEL
ALT: 11 U/L (ref 0–35)
AST: 16 U/L (ref 0–37)
Albumin: 3.6 g/dL (ref 3.5–5.2)
Alkaline Phosphatase: 51 U/L (ref 39–117)
BUN: 24 mg/dL — ABNORMAL HIGH (ref 6–23)
CO2: 30 mEq/L (ref 19–32)
Calcium: 8.6 mg/dL (ref 8.4–10.5)
Chloride: 104 mEq/L (ref 96–112)
Creatinine, Ser: 0.59 mg/dL (ref 0.40–1.20)
GFR: 103.17 mL/min (ref >60.00)
Glucose, Bld: 86 mg/dL (ref 70–99)
Potassium: 3.7 mEq/L (ref 3.5–5.1)
Sodium: 140 mEq/L (ref 135–145)
Total Bilirubin: 0.4 mg/dL (ref 0.2–1.2)
Total Protein: 6.7 g/dL (ref 6.0–8.3)

## 2019-07-07 LAB — LIPID PANEL
Cholesterol: 158 mg/dL (ref 0–200)
HDL: 69 mg/dL (ref >39.00)
LDL Cholesterol: 74 mg/dL (ref 0–99)
NonHDL: 89.39
Total CHOL/HDL Ratio: 2
Triglycerides: 76 mg/dL (ref 0.0–149.0)
VLDL: 15.2 mg/dL (ref 0.0–40.0)

## 2019-07-07 LAB — VITAMIN D 25 HYDROXY (VIT D DEFICIENCY, FRACTURES): VITD: 47.69 ng/mL (ref 30.00–100.00)

## 2019-07-21 ENCOUNTER — Encounter: Payer: Self-pay | Admitting: Internal Medicine

## 2019-07-21 DIAGNOSIS — K224 Dyskinesia of esophagus: Secondary | ICD-10-CM | POA: Insufficient documentation

## 2019-07-29 ENCOUNTER — Ambulatory Visit: Payer: 59 | Admitting: Gastroenterology

## 2019-08-27 ENCOUNTER — Ambulatory Visit (INDEPENDENT_AMBULATORY_CARE_PROVIDER_SITE_OTHER): Payer: 59 | Admitting: Gastroenterology

## 2019-08-27 ENCOUNTER — Encounter: Payer: Self-pay | Admitting: Gastroenterology

## 2019-08-27 ENCOUNTER — Other Ambulatory Visit: Payer: Self-pay

## 2019-08-27 VITALS — BP 115/62 | HR 84 | Temp 97.5°F | Ht 64.0 in | Wt 126.2 lb

## 2019-08-27 DIAGNOSIS — K224 Dyskinesia of esophagus: Secondary | ICD-10-CM

## 2019-08-27 DIAGNOSIS — R0989 Other specified symptoms and signs involving the circulatory and respiratory systems: Secondary | ICD-10-CM

## 2019-08-27 NOTE — Progress Notes (Signed)
Gastroenterology Consultation  Referring Provider:     [name], MD Primary Care Physician:  [name], MD Primary Gastroenterologist:  Dr. Allen Norris     Reason for Consultation:     Dysphagia        HPI:   [name] [name] is a 64 y.o. y/o female referred for consultation & management of dysphagia by Dr. [name], MD.  This patient was admitted for dysphagia.  The patient has been seen by Dr. Tami Ribas and previously had been following with Dr. Vira Agar.  The patient had an EGD in September 2019 and at that time had biopsies of the esophagus that showed findings consistent with reflux but no narrowing or strictures were seen.  The patient's biopsies also showed gastric intestinal metaplasia.  It does not appear that the patient was brought back for gastric mapping at that time.  In 2013 the patient also had an upper endoscopy with signs of reflux in the esophagus biopsies.  The patient has been on omeprazole 20 mg a day.  The patient had a modified barium swallow with the findings of:  Mild oropharyngeal dysphagia characterized by delayed pharyngeal swallow initiation.  Oral control of the bolus including oral hold, rotary mastication, and anterior to posterior transfer is within functional limits.   Aspects of the pharyngeal stage of swallowing including tongue base retraction, hyolaryngeal excursion, epiglottic inversion, and duration/amplitude of UES opening are within normal limits.  There is no observed pharyngeal residue, laryngeal penetration, or tracheal aspiration.  The patient is not at risk for prandial aspiration.  The patient was given a barium tablet to take with water.  This passed promptly through the pharynx and the cervical esophagus.  A barium tablet taken in applesauce paused briefly in the valleculae but cleared the pharynx with the follow up swallow.  This tablet lodged in the cervical esophagus.  Upon standing for an A-P view, the tablet was still in the distal  esophagus and moved into the stomach with a swallow of applesauce.  The tablet did not appear to lodge due to narrowing of the esophagus, suggestive of esophageal dysmotility.  The patient was assured that the tablet is getting into her stomach with follow up swallows despite the lingering sensation of the tablet in her esophagus.    The patient reports that she is not having any problems with solid food such as steak chicken bone pork.  She only has problems with pills.  She reports that her last GI PA had told her that reflux could not cause a globus feeling like she is having.  The patient has been on omeprazole 40 mg a day in the past that she breaks open and put on applesauce but then she went down to 20 mg a day.     Past Medical History:  Diagnosis Date  . Allergy   . Anxiety   . Hyperlipidemia     Past Surgical History:  Procedure Laterality Date  . ESOPHAGOGASTRODUODENOSCOPY (EGD) WITH PROPOFOL N/A 11/25/2017   Procedure: ESOPHAGOGASTRODUODENOSCOPY (EGD) WITH PROPOFOL;  Surgeon: Manya Silvas, MD;  Location: [facility] ENDOSCOPY;  Service: Endoscopy;  Laterality: N/A;  . MANDIBLE FRACTURE SURGERY      Prior to Admission medications   Medication Sig Start Date End Date Taking? Authorizing Provider  Calcium Carb-Cholecalciferol (CALCIUM 500 + D) 500-200 MG-UNIT TABS Take by mouth. Taking Doterra supplement; 2 tabs in am, will start 1 tab in pm for total of 750mg  Calcium daily  [provider]  Cholecalciferol (VITAMIN D3) 1000 units CAPS Take 1 capsule by mouth daily.    [provider]  escitalopram (LEXAPRO) 5 MG tablet Take 1 tablet (5 mg total) by mouth daily. Patient not taking: Reported on 12/23/2018 09/03/18   [name], MD  omeprazole (PRILOSEC) 20 MG capsule Take 20 mg by mouth every other day.  08/07/18   [provider]  raloxifene (EVISTA) 60 MG tablet TAKE 1 TABLET (60 MG TOTAL) BY MOUTH DAILY. 06/22/19   [name], MD  vitamin B-12  (CYANOCOBALAMIN) 1000 MCG tablet Take 1,000 mcg by mouth daily.    [provider]    Family History  Problem Relation Age of Onset  . Hyperlipidemia Mother   . Diabetes Mother   . COPD Mother   . Heart disease Mother        smoker  . Cancer Mother        lung CA mets   . Hyperlipidemia Father   . Heart disease Father 25       ami, smoker  . Early death Father   . Stroke Maternal Grandmother   . Stroke Sister   . COPD Sister   . Cancer Sister 71       lymphoma  . Diabetes Brother   . Breast cancer Neg Hx      Social History   Tobacco Use  . Smoking status: Never Smoker  . Smokeless tobacco: Never Used  Vaping Use  . Vaping Use: Never used  Substance Use Topics  . Alcohol use: No  . Drug use: Never    Allergies as of 08/27/2019 - Review Complete 06/18/2019  Allergen Reaction Noted  . Codeine  06/07/2011  . Singulair [montelukast sodium] Rash 06/03/2013    Review of Systems:    All systems reviewed and negative except where noted in HPI.   Physical Exam:  LMP 11/20/2012  Patient's last menstrual period was 11/20/2012. General:   Alert,  Well-developed, well-nourished, pleasant and cooperative in NAD Head:  Normocephalic and atraumatic. Eyes:  Sclera clear, no icterus.   Conjunctiva pink. Ears:  Normal auditory acuity. Neck:  Supple; no masses or thyromegaly. Lungs:  Respirations even and unlabored.  Clear throughout to auscultation.   No wheezes, crackles, or rhonchi. No acute distress. Heart:  Regular rate and rhythm; no murmurs, clicks, rubs, or gallops. Abdomen:  Normal bowel sounds.  No bruits.  Soft, non-tender and non-distended without masses, hepatosplenomegaly or hernias noted.  No guarding or rebound tenderness.  Negative Carnett sign.   Rectal:  Deferred.  Pulses:  Normal pulses noted. Extremities:  No clubbing or edema.  No cyanosis. Neurologic:  Alert and oriented x3;  grossly normal neurologically. Skin:  Intact without significant  lesions or rashes.  No jaundice. Lymph Nodes:  No significant cervical adenopathy. Psych:  Alert and cooperative. Normal mood and affect.  Imaging Studies: No results found.  Assessment and Plan:   [name] [name] is a 64 y.o. y/o female who comes in today with a globus feeling in the esophagus and dysphagia to pills.  The patient has been hesitant to take a PPI due to the history of side effects that she has heard of.  She continues to have trouble with the globus sensation in addition to trouble swallowing pills.  The patient's modified barium swallow showed findings consistent with a motility issue rather than a stricture or narrowing.  The patient was able to swallow the 13 mm barium  pill for the procedure therefore she has been told that Dexilant may give [place] information on whether this is caused by reflux by better decreasing the acid in her Prilosec 20 mg a day.  The patient has been given samples of Dexilant to be taken for the next few weeks to see if her symptoms improve.  The patient will also be set up for an upper endoscopy for gastric mapping due to her history of gastric intestinal metaplasia without any further follow-up since her previous endoscopy.  The patient has been explained the plan and agrees with it.    Lucilla Lame, MD. Marval Regal    Note: This dictation was prepared with Dragon dictation along with smaller phrase technology. Any transcriptional errors that result from this process are unintentional.

## 2019-08-28 ENCOUNTER — Other Ambulatory Visit: Payer: Self-pay

## 2019-08-28 DIAGNOSIS — R0989 Other specified symptoms and signs involving the circulatory and respiratory systems: Secondary | ICD-10-CM

## 2019-08-28 DIAGNOSIS — K224 Dyskinesia of esophagus: Secondary | ICD-10-CM

## 2019-09-03 ENCOUNTER — Other Ambulatory Visit: Payer: Self-pay

## 2019-09-03 MED ORDER — DEXILANT 60 MG PO CPDR
60.0000 mg | DELAYED_RELEASE_CAPSULE | Freq: Every day | ORAL | 6 refills | Status: DC
Start: 2019-09-03 — End: 2019-12-15

## 2019-09-07 ENCOUNTER — Encounter: Payer: Self-pay | Admitting: Internal Medicine

## 2019-09-07 ENCOUNTER — Ambulatory Visit: Payer: 59 | Admitting: Internal Medicine

## 2019-09-07 ENCOUNTER — Other Ambulatory Visit: Payer: Self-pay

## 2019-09-07 VITALS — BP 108/68 | HR 83 | Temp 97.8°F | Resp 15 | Ht 64.0 in | Wt 126.8 lb

## 2019-09-07 DIAGNOSIS — Z0000 Encounter for general adult medical examination without abnormal findings: Secondary | ICD-10-CM

## 2019-09-07 DIAGNOSIS — M810 Age-related osteoporosis without current pathological fracture: Secondary | ICD-10-CM | POA: Diagnosis not present

## 2019-09-07 DIAGNOSIS — F418 Other specified anxiety disorders: Secondary | ICD-10-CM | POA: Diagnosis not present

## 2019-09-07 DIAGNOSIS — Z1231 Encounter for screening mammogram for malignant neoplasm of breast: Secondary | ICD-10-CM | POA: Diagnosis not present

## 2019-09-07 NOTE — Progress Notes (Signed)
Patient ID: [name] [name], female    DOB: 18-Nov-1955  Age: 64 y.o. MRN: [id number]  The patient is here for annual  preventive  examination and management of other chronic and acute problems.   The risk factors are reflected in the social history.  The roster of all physicians providing medical care to patient - is listed in the Snapshot section of the chart.  Activities of daily living:  The patient is 100% independent in all ADLs: dressing, toileting, feeding as well as independent mobility  Home safety : The patient has smoke detectors in the home. They wear seatbelts.  There are no firearms at home. There is no violence in the home.   There is no risks for hepatitis, STDs or HIV. There is no   history of blood transfusion. They have no travel history to infectious disease endemic areas of the world.  The patient has seen their dentist in the last six month. They have seen their eye doctor in the last year. She denies hearing difficulty with regard to whispered voices and some television programs.  She has deferred audiologic testing in the last year.  They do not  have excessive sun exposure. Discussed the need for sun protection: hats, long sleeves and use of sunscreen if there is significant sun exposure.   Diet: the importance of a healthy diet is discussed. They do have a healthy diet.  The benefits of regular aerobic exercise were discussed. She walks 4 times per week ,  20 minutes.   Depression screen: there are no signs or vegative symptoms of depression- irritability, change in appetite, anhedonia, sadness/tearfullness.  Cognitive assessment: the patient manages all their financial and personal affairs and is actively engaged. They could relate day,date,year and events; recalled 2/3 objects at 3 minutes; performed clock-face test normally.  The following portions of the patient's history were reviewed and updated as appropriate: allergies, current medications, past family  history, past medical history,  past surgical history, past social history  and problem list.  Visual acuity was not assessed per patient preference since she has regular follow up with her ophthalmologist. Hearing and body mass index were assessed and reviewed.   During the course of the visit the patient was educated and counseled about appropriate screening and preventive services including : fall prevention , diabetes screening, nutrition counseling, colorectal cancer screening, and recommended immunizations.    CC: The primary encounter diagnosis was Encounter for screening mammogram for malignant neoplasm of breast. Diagnoses of Age-related osteoporosis without current pathological fracture, Anxiety about health, and Encounter for preventive health examination were also pertinent to this visit.  1) anxiety about health.  Chronic, lifelong.    2) Esophageal metaplasia and globus:  Persistent symptoms.   Seeing Wohl.   rx dexilant.  EGD in August planned.  Swallow study normal except for anxiety about neck issues (normal ultrasound in June 2020)  Has fear of pills   3) Osteoporosis:  Coming up on one year of therapy; tolerating medication but  struggling with calcium supplementation given fear of pills.    History Torria has a past medical history of Allergy, Anxiety, and Hyperlipidemia.   She has a past surgical history that includes Mandible fracture surgery and Esophagogastroduodenoscopy (egd) with propofol (N/A, 11/25/2017).   Her family history includes COPD in her mother and sister; Cancer in her mother; Cancer (age of onset: 24) in her sister; Diabetes in her brother and mother; Early death in her father; Heart disease in her  mother; Heart disease (age of onset: 35) in her father; Hyperlipidemia in her father and mother; Stroke in her maternal grandmother and sister.She reports that she has never smoked. She has never used smokeless tobacco. She reports that she does not drink alcohol and  does not use drugs.  Outpatient Medications Prior to Visit  Medication Sig Dispense Refill  . calcium citrate-vitamin D (CELEBRATE CALCIUM CITRATE) 500-500 MG-UNIT chewable tablet     . Cholecalciferol (VITAMIN D3) 25 MCG (1000 UT) CHEW Chew 1,000 Units by mouth daily.     . Cyanocobalamin (VITAMIN B-12) 3000 MCG SUBL Place 3,000 mcg under the tongue daily.    Marland Kitchen dexlansoprazole (DEXILANT) 60 MG capsule Take 1 capsule (60 mg total) by mouth daily. 30 capsule 6  . raloxifene (EVISTA) 60 MG tablet TAKE 1 TABLET (60 MG TOTAL) BY MOUTH DAILY. 90 tablet 5  . escitalopram (LEXAPRO) 5 MG tablet Take 1 tablet (5 mg total) by mouth daily. (Patient not taking: Reported on 12/23/2018) 90 tablet 0  . omeprazole (PRILOSEC) 20 MG capsule Take 20 mg by mouth every other day.  (Patient not taking: Reported on 09/07/2019)    . vitamin B-12 (CYANOCOBALAMIN) 1000 MCG tablet Take 1,000 mcg by mouth daily. Taking 3,074mcg daily (Patient not taking: Reported on 09/07/2019)     No facility-administered medications prior to visit.    Review of Systems   Patient denies headache, fevers, malaise, unintentional weight loss, skin rash, eye pain, sinus congestion and sinus pain, sore throat, dysphagia,  hemoptysis , cough, dyspnea, wheezing, chest pain, palpitations, orthopnea, edema, abdominal pain, nausea, melena, diarrhea, constipation, flank pain, dysuria, hematuria, urinary  Frequency, nocturia, numbness, tingling, seizures,  Focal weakness, Loss of consciousness,  Tremor, insomnia, depression, anxiety, and suicidal ideation.     Objective:  BP 108/68 (BP Location: Left Arm, Patient Position: Sitting, Cuff Size: Normal)   Pulse 83   Temp 97.8 F (36.6 C) (Temporal)   Resp 15   Ht 5\' 4"  (1.626 m)   Wt 126 lb 12.8 oz (57.5 kg)   LMP 11/20/2012   SpO2 99%   BMI 21.77 kg/m   Physical Exam  General appearance: alert, cooperative and appears stated age Head: Normocephalic, without obvious abnormality,  atraumatic Eyes: conjunctivae/corneas clear. PERRL, EOM's intact. Fundi benign. Ears: normal TM's and external ear canals both ears Nose: Nares normal. Septum midline. Mucosa normal. No drainage or sinus tenderness. Throat: lips, mucosa, and tongue normal; teeth and gums normal Neck: no adenopathy, no carotid bruit, no JVD, supple, symmetrical, trachea midline and thyroid not enlarged, symmetric, no tenderness/mass/nodules Lungs: clear to auscultation bilaterally Breasts: normal appearance, no masses or tenderness Heart: regular rate and rhythm, S1, S2 normal, no murmur, click, rub or gallop Abdomen: soft, non-tender; bowel sounds normal; no masses,  no organomegaly Extremities: extremities normal, atraumatic, no cyanosis or edema Pulses: 2+ and symmetric Skin: Skin color, texture, turgor normal. No rashes or lesions Neurologic: Alert and oriented X 3, normal strength and tone. Normal symmetric reflexes. Normal coordination and gait.     Assessment & Plan:   Problem List Items Addressed This Visit      Unprioritized   Anxiety about health    She denies any overt symptoms of anxiety .  Defers use of lexapro       Encounter for preventive health examination    age appropriate education and counseling updated, referrals for preventative services and immunizations addressed, dietary and smoking counseling addressed, most recent labs reviewed.  I have personally  reviewed and have noted:  1) the patient's medical and social history 2) The pt's use of alcohol, tobacco, and illicit drugs 3) The patient's current medications and supplements 4) Functional ability including ADL's, fall risk, home safety risk, hearing and visual impairment 5) Diet and physical activities 6) Evidence for depression or mood disorder 7) The patient's height, weight, and BMI have been recorded in the chart  I have made referrals, and provided counseling and education based on review of the above       Osteoporosis    Continue Evista,  Calcium and Vitamin D,  Weight bearing exercise.   1 yr follow up DEXA due in August       Relevant Medications   Cholecalciferol (VITAMIN D3) 25 MCG (1000 UT) CHEW   Other Relevant Orders   DG Bone Density    Other Visit Diagnoses    Encounter for screening mammogram for malignant neoplasm of breast    -  Primary   Relevant Orders   MM 3D SCREEN BREAST BILATERAL      I have discontinued Pamala Hurry K. Budreau's omeprazole. I am also having her maintain her escitalopram, raloxifene, Celebrate Calcium Citrate, Dexilant, Vitamin D3, and Vitamin B-12.  No orders of the defined types were placed in this encounter.   Medications Discontinued During This Encounter  Medication Reason  . omeprazole (PRILOSEC) 20 MG capsule Change in therapy  . vitamin B-12 (CYANOCOBALAMIN) 1000 MCG tablet     Follow-up: Return in about 1 year (around 09/06/2020).   [name], MD

## 2019-09-07 NOTE — Patient Instructions (Addendum)
Your annual mammogram has been ordered and is due in August .  You are encouraged (required) to call to make your appointment at [facility] [phone]   Continue Evista for the osteoporosis and I agree with the bariatric chewable calcium/Vitamin D  For your esophagus:  If insurance will not pay for Dexilant,  Try using Nexium (esomeprazole)  20 mg two times daily .  It is in capsule form so it can be opened and mixed with apple sauce   I would also look for an antihistamine that may be available in children's liquid form (allegra, zyrtec or claritin) to replace the singulair that you felt helped your throat    We will order your DEXA scan in August as well   Read  Nelda Bucks   [place] [facility] the Mind  [facility] Joneen Roach has it!)   Health Maintenance for Postmenopausal Women Menopause is a normal process in which your ability to get pregnant comes to an end. This process happens slowly over many months or years, usually between the ages of 13 and 38. Menopause is complete when you have missed your menstrual periods for 12 months. It is important to talk with your health care provider about some of the most common conditions that affect women after menopause (postmenopausal women). These include heart disease, cancer, and bone loss (osteoporosis). Adopting a healthy lifestyle and getting preventive care can help to promote your health and wellness. The actions you take can also lower your chances of developing some of these common conditions. What should I know about menopause? During menopause, you may get a number of symptoms, such as:  Hot flashes. These can be moderate or severe.  Night sweats.  Decrease in sex drive.  Mood swings.  Headaches.  Tiredness.  Irritability.  Memory problems.  Insomnia. Choosing to treat or not to treat these symptoms is a decision that you make with your health care provider. Do I need hormone replacement therapy?  Hormone replacement therapy  is effective in treating symptoms that are caused by menopause, such as hot flashes and night sweats.  Hormone replacement carries certain risks, especially as you become older. If you are thinking about using estrogen or estrogen with progestin, discuss the benefits and risks with your health care provider. What is my risk for heart disease and stroke? The risk of heart disease, heart attack, and stroke increases as you age. One of the causes may be a change in the body's hormones during menopause. This can affect how your body uses dietary fats, triglycerides, and cholesterol. Heart attack and stroke are medical emergencies. There are many things that you can do to help prevent heart disease and stroke. Watch your blood pressure  High blood pressure causes heart disease and increases the risk of stroke. This is more likely to develop in people who have high blood pressure readings, are of African descent, or are overweight.  Have your blood pressure checked: ? Every 3-5 years if you are 70-26 years of age. ? Every year if you are 79 years old or older. Eat a healthy diet   Eat a diet that includes plenty of vegetables, fruits, low-fat dairy products, and lean protein.  Do not eat a lot of foods that are high in solid fats, added sugars, or sodium. Get regular exercise Get regular exercise. This is one of the most important things you can do for your health. Most adults should:  Try to exercise for at least 150 minutes each week.  The exercise should increase your heart rate and make you sweat (moderate-intensity exercise).  Try to do strengthening exercises at least twice each week. Do these in addition to the moderate-intensity exercise.  Spend less time sitting. Even light physical activity can be beneficial. Other tips  Work with your health care provider to achieve or maintain a healthy weight.  Do not use any products that contain nicotine or tobacco, such as cigarettes,  e-cigarettes, and chewing tobacco. If you need help quitting, ask your health care provider.  Know your numbers. Ask your health care provider to check your cholesterol and your blood sugar (glucose). Continue to have your blood tested as directed by your health care provider. Do I need screening for cancer? Depending on your health history and family history, you may need to have cancer screening at different stages of your life. This may include screening for:  Breast cancer.  Cervical cancer.  Lung cancer.  Colorectal cancer. What is my risk for osteoporosis? After menopause, you may be at increased risk for osteoporosis. Osteoporosis is a condition in which bone destruction happens more quickly than new bone creation. To help prevent osteoporosis or the bone fractures that can happen because of osteoporosis, you may take the following actions:  If you are 32-48 years old, get at least 1,000 mg of calcium and at least 600 mg of vitamin D per day.  If you are older than age 43 but younger than age 70, get at least 1,200 mg of calcium and at least 600 mg of vitamin D per day.  If you are older than age 39, get at least 1,200 mg of calcium and at least 800 mg of vitamin D per day. Smoking and drinking excessive alcohol increase the risk of osteoporosis. Eat foods that are rich in calcium and vitamin D, and do weight-bearing exercises several times each week as directed by your health care provider. How does menopause affect my mental health? Depression may occur at any age, but it is more common as you become older. Common symptoms of depression include:  Low or sad mood.  Changes in sleep patterns.  Changes in appetite or eating patterns.  Feeling an overall lack of motivation or enjoyment of activities that you previously enjoyed.  Frequent crying spells. Talk with your health care provider if you think that you are experiencing depression. General instructions See your health  care provider for regular wellness exams and vaccines. This may include:  Scheduling regular health, dental, and eye exams.  Getting and maintaining your vaccines. These include: ? Influenza vaccine. Get this vaccine each year before the flu season begins. ? Pneumonia vaccine. ? Shingles vaccine. ? Tetanus, diphtheria, and pertussis (Tdap) booster vaccine. Your health care provider may also recommend other immunizations. Tell your health care provider if you have ever been abused or do not feel safe at home. Summary  Menopause is a normal process in which your ability to get pregnant comes to an end.  This condition causes hot flashes, night sweats, decreased interest in sex, mood swings, headaches, or lack of sleep.  Treatment for this condition may include hormone replacement therapy.  Take actions to keep yourself healthy, including exercising regularly, eating a healthy diet, watching your weight, and checking your blood pressure and blood sugar levels.  Get screened for cancer and depression. Make sure that you are up to date with all your vaccines. This information is not intended to replace advice given to you by your health care  provider. Make sure you discuss any questions you have with your health care provider. Document Revised: 02/26/2018 Document Reviewed: 02/26/2018 Elsevier Patient Education  2020 [employer].

## 2019-09-08 NOTE — Assessment & Plan Note (Signed)
She denies any overt symptoms of anxiety .  Defers use of lexapro

## 2019-09-08 NOTE — Assessment & Plan Note (Signed)
Continue Evista,  Calcium and Vitamin D,  Weight bearing exercise.   1 yr follow up DEXA due in August

## 2019-09-08 NOTE — Assessment & Plan Note (Signed)

## 2019-10-26 ENCOUNTER — Encounter: Payer: Self-pay | Admitting: Gastroenterology

## 2019-10-26 ENCOUNTER — Other Ambulatory Visit: Payer: Self-pay

## 2019-10-26 NOTE — Anesthesia Preprocedure Evaluation (Addendum)
Anesthesia Evaluation  Patient identified by MRN, date of birth, ID band Patient awake    Reviewed: Allergy & Precautions, NPO status , Patient's Chart, lab work & pertinent test results  History of Anesthesia Complications Negative for: history of anesthetic complications  Airway Mallampati: II  TM Distance: >3 FB Neck ROM: Full    Dental no notable dental hx.    Pulmonary neg pulmonary ROS, neg sleep apnea, neg COPD,    breath sounds clear to auscultation- rhonchi (-) wheezing      Cardiovascular Exercise Tolerance: Good (-) hypertension(-) CAD, (-) Past MI, (-) Cardiac Stents and (-) CABG  Rhythm:Regular Rate:Normal - Systolic murmurs and - Diastolic murmurs  HLD   Neuro/Psych neg Seizures Anxiety negative neurological ROS     GI/Hepatic Neg liver ROS, GERD  , Esophageal motility   Endo/Other  negative endocrine ROSneg diabetes  Renal/GU negative Renal ROS     Musculoskeletal negative musculoskeletal ROS (+)   Abdominal (+) - obese,   Peds  Hematology negative hematology ROS (+)   Anesthesia Other Findings Past Medical History: No date: Allergy No date: Anxiety No date: GERD (gastroesophageal reflux disease) No date: Hyperlipidemia No date: Presence of dental prosthetic device     Comment:  Implant - 1 tooth, top front No date: Wears contact lenses   Reproductive/Obstetrics                            Anesthesia Physical Anesthesia Plan  ASA: II  Anesthesia Plan: General   Post-op Pain Management:    Induction: Intravenous  PONV Risk Score and Plan: 2 and Propofol infusion, TIVA and Treatment may vary due to age or medical condition  Airway Management Planned: Natural Airway and Nasal Cannula  Additional Equipment:   Intra-op Plan:   Post-operative Plan:   Informed Consent: I have reviewed the patients History and Physical, chart, labs and discussed the procedure  including the risks, benefits and alternatives for the proposed anesthesia with the patient or authorized representative who has indicated his/her understanding and acceptance.       Plan Discussed with: CRNA and Anesthesiologist  Anesthesia Plan Comments:        Anesthesia Quick Evaluation

## 2019-10-27 ENCOUNTER — Telehealth: Payer: Self-pay | Admitting: Internal Medicine

## 2019-10-27 NOTE — Telephone Encounter (Signed)
Pt called in had a question about her medication having a surgery on Friday need a call back

## 2019-10-28 ENCOUNTER — Other Ambulatory Visit: Payer: Self-pay

## 2019-10-28 ENCOUNTER — Other Ambulatory Visit
Admission: RE | Admit: 2019-10-28 | Discharge: 2019-10-28 | Disposition: A | Discharge status: Home / Self Care | Payer: 59 | Source: Ambulatory Visit | Attending: Gastroenterology | Admitting: Gastroenterology

## 2019-10-28 DIAGNOSIS — Z01812 Encounter for preprocedural laboratory examination: Secondary | ICD-10-CM | POA: Diagnosis not present

## 2019-10-28 DIAGNOSIS — Z20822 Contact with and (suspected) exposure to covid-19: Secondary | ICD-10-CM | POA: Diagnosis not present

## 2019-10-28 LAB — SARS CORONAVIRUS 2 (TAT 6-24 HRS): SARS Coronavirus 2: NEGATIVE

## 2019-10-28 NOTE — Telephone Encounter (Signed)
Spoke with pt and she stated that she is having surgery on Friday. She stated that she stopped taking the Evista because it has a side effect of blood clot. Pt just wanted to make sure it was okay for her to not take until after her surgery.

## 2019-10-28 NOTE — Telephone Encounter (Signed)
MyChart message sent ok to hold Evista

## 2019-10-29 NOTE — Telephone Encounter (Signed)
Pt has read mychart message.

## 2019-10-29 NOTE — Discharge Instructions (Signed)
General Anesthesia, Adult, Care After This sheet gives you information about how to care for yourself after your procedure. Your health care provider may also give you more specific instructions. If you have problems or questions, contact your health care provider. What can I expect after the procedure? After the procedure, the following side effects are common:  Pain or discomfort at the IV site.  Nausea.  Vomiting.  Sore throat.  Trouble concentrating.  Feeling cold or chills.  Weak or tired.  Sleepiness and fatigue.  Soreness and body aches. These side effects can affect parts of the body that were not involved in surgery. Follow these instructions at home:  For at least 24 hours after the procedure:  Have a responsible adult stay with you. It is important to have someone help care for you until you are awake and alert.  Rest as needed.  Do not: ? Participate in activities in which you could fall or become injured. ? Drive. ? Use heavy machinery. ? Drink alcohol. ? Take sleeping pills or medicines that cause drowsiness. ? Make important decisions or sign legal documents. ? Take care of children on your own. Eating and drinking  Follow any instructions from your health care provider about eating or drinking restrictions.  When you feel hungry, start by eating small amounts of foods that are soft and easy to digest (bland), such as toast. Gradually return to your regular diet.  Drink enough fluid to keep your urine pale yellow.  If you vomit, rehydrate by drinking water, juice, or clear broth. General instructions  If you have sleep apnea, surgery and certain medicines can increase your risk for breathing problems. Follow instructions from your health care provider about wearing your sleep device: ? Anytime you are sleeping, including during daytime naps. ? While taking prescription pain medicines, sleeping medicines, or medicines that make you drowsy.  Return to  your normal activities as told by your health care provider. Ask your health care provider what activities are safe for you.  Take over-the-counter and prescription medicines only as told by your health care provider.  If you smoke, do not smoke without supervision.  Keep all follow-up visits as told by your health care provider. This is important. Contact a health care provider if:  You have nausea or vomiting that does not get better with medicine.  You cannot eat or drink without vomiting.  You have pain that does not get better with medicine.  You are unable to pass urine.  You develop a skin rash.  You have a fever.  You have redness around your IV site that gets worse. Get help right away if:  You have difficulty breathing.  You have chest pain.  You have blood in your urine or stool, or you vomit blood. Summary  After the procedure, it is common to have a sore throat or nausea. It is also common to feel tired.  Have a responsible adult stay with you for the first 24 hours after general anesthesia. It is important to have someone help care for you until you are awake and alert.  When you feel hungry, start by eating small amounts of foods that are soft and easy to digest (bland), such as toast. Gradually return to your regular diet.  Drink enough fluid to keep your urine pale yellow.  Return to your normal activities as told by your health care provider. Ask your health care provider what activities are safe for you. This information is not   intended to replace advice given to you by your health care provider. Make sure you discuss any questions you have with your health care provider. Document Revised: 03/08/2017 Document Reviewed: 10/19/2016 Elsevier Patient Education  2020 Elsevier Inc.  

## 2019-10-30 ENCOUNTER — Other Ambulatory Visit: Payer: Self-pay

## 2019-10-30 ENCOUNTER — Encounter: Payer: Self-pay | Admitting: Anesthesiology

## 2019-10-30 ENCOUNTER — Encounter
Admission: RE | Disposition: A | Discharge status: Home / Self Care | Payer: Self-pay | Source: Home / Self Care | Attending: Gastroenterology

## 2019-10-30 ENCOUNTER — Encounter: Payer: Self-pay | Admitting: Gastroenterology

## 2019-10-30 ENCOUNTER — Ambulatory Visit
Admission: RE | Admit: 2019-10-30 | Discharge: 2019-10-30 | Disposition: A | Discharge status: Home / Self Care | Payer: 59 | Attending: Gastroenterology | Admitting: Gastroenterology

## 2019-10-30 DIAGNOSIS — Z888 Allergy status to other drugs, medicaments and biological substances status: Secondary | ICD-10-CM | POA: Diagnosis not present

## 2019-10-30 DIAGNOSIS — R1310 Dysphagia, unspecified: Secondary | ICD-10-CM

## 2019-10-30 DIAGNOSIS — K219 Gastro-esophageal reflux disease without esophagitis: Secondary | ICD-10-CM | POA: Diagnosis not present

## 2019-10-30 DIAGNOSIS — Z885 Allergy status to narcotic agent status: Secondary | ICD-10-CM | POA: Insufficient documentation

## 2019-10-30 DIAGNOSIS — K224 Dyskinesia of esophagus: Secondary | ICD-10-CM | POA: Diagnosis not present

## 2019-10-30 DIAGNOSIS — Z79899 Other long term (current) drug therapy: Secondary | ICD-10-CM | POA: Insufficient documentation

## 2019-10-30 DIAGNOSIS — R0989 Other specified symptoms and signs involving the circulatory and respiratory systems: Secondary | ICD-10-CM | POA: Diagnosis not present

## 2019-10-30 DIAGNOSIS — K222 Esophageal obstruction: Secondary | ICD-10-CM | POA: Diagnosis not present

## 2019-10-30 DIAGNOSIS — Z79810 Long term (current) use of selective estrogen receptor modulators (SERMs): Secondary | ICD-10-CM | POA: Diagnosis not present

## 2019-10-30 HISTORY — PX: ESOPHAGOGASTRODUODENOSCOPY (EGD) WITH PROPOFOL: SHX5813

## 2019-10-30 HISTORY — DX: Gastro-esophageal reflux disease without esophagitis: K21.9

## 2019-10-30 HISTORY — DX: Presence of spectacles and contact lenses: Z97.3

## 2019-10-30 HISTORY — DX: Presence of dental prosthetic device (complete) (partial): Z97.2

## 2019-10-30 SURGERY — ESOPHAGOGASTRODUODENOSCOPY (EGD) WITH PROPOFOL
Anesthesia: General

## 2019-10-30 MED ORDER — PROPOFOL 500 MG/50ML IV EMUL
INTRAVENOUS | Status: DC | PRN
  Administered 2019-10-30: 155 ug/kg/min via INTRAVENOUS

## 2019-10-30 MED ORDER — GLYCOPYRROLATE 0.2 MG/ML IJ SOLN
INTRAMUSCULAR | Status: DC | PRN
  Administered 2019-10-30: .2 mg via INTRAVENOUS

## 2019-10-30 MED ORDER — LIDOCAINE HCL (CARDIAC) PF 100 MG/5ML IV SOSY
PREFILLED_SYRINGE | INTRAVENOUS | Status: DC | PRN
  Administered 2019-10-30: 100 mg via INTRAVENOUS

## 2019-10-30 MED ORDER — SODIUM CHLORIDE 0.9 % IV SOLN: INTRAVENOUS | Status: DC

## 2019-10-30 MED ORDER — PROPOFOL 10 MG/ML IV BOLUS
INTRAVENOUS | Status: DC | PRN
  Administered 2019-10-30: 10 mg via INTRAVENOUS
  Administered 2019-10-30: 50 mg via INTRAVENOUS

## 2019-10-30 NOTE — Anesthesia Postprocedure Evaluation (Signed)
Anesthesia Post Note  Patient: [name] [name]  Procedure(s) Performed: ESOPHAGOGASTRODUODENOSCOPY (EGD) WITH PROPOFOL (N/A )  Patient location during evaluation: Endoscopy Anesthesia Type: General Level of consciousness: awake and alert and oriented Pain management: pain level controlled Vital Signs Assessment: post-procedure vital signs reviewed and stable Respiratory status: spontaneous breathing, nonlabored ventilation and respiratory function stable Cardiovascular status: blood pressure returned to baseline and stable Postop Assessment: no signs of nausea or vomiting Anesthetic complications: no   No complications documented.   Last Vitals:  Vitals:   10/30/19 1020 10/30/19 1030  BP: 124/60 127/80  Pulse: 81 83  Resp: 17 17  Temp:    SpO2: 99% 100%    Last Pain:  Vitals:   10/30/19 1000  TempSrc: Temporal  PainSc:                  Vernell Back

## 2019-10-30 NOTE — Anesthesia Procedure Notes (Signed)
Performed by: Kelton Pillar, CRNA

## 2019-10-30 NOTE — Op Note (Signed)
[facility] Patient Name: [name] [name] Procedure Date: 10/30/2019 9:34 AM MRN: [id number] Account #: [id number] Date of Birth: 11/08/1955 Admit Type: Outpatient Age: 64 Room: [facility] ENDO ROOM 2 Gender: Female Note Status: Finalized Procedure:             Upper GI endoscopy Indications:           Dysphagia Providers:             Lucilla Lame MD, MD Referring MD:          Deborra Medina, MD (Referring MD) Medicines:             Propofol per Anesthesia Complications:         No immediate complications. Procedure:             Pre-Anesthesia Assessment:                        - Prior to the procedure, a History and Physical was                         performed, and patient medications and allergies were                         reviewed. The patient's tolerance of previous                         anesthesia was also reviewed. The risks and benefits                         of the procedure and the sedation options and risks                         were discussed with the patient. All questions were                         answered, and informed consent was obtained. Prior                         Anticoagulants: The patient has taken no previous                         anticoagulant or antiplatelet agents. ASA Grade                         Assessment: II - A patient with mild systemic disease.                         After reviewing the risks and benefits, the patient                         was deemed in satisfactory condition to undergo the                         procedure.                        After obtaining informed consent, the endoscope was  passed under direct vision. Throughout the procedure,                         the patient's blood pressure, pulse, and oxygen                         saturations were monitored continuously. The Endoscope                         was introduced through the mouth, and advanced to the                          second part of duodenum. The upper GI endoscopy was                         accomplished without difficulty. The patient tolerated                         the procedure well. Findings:      The examined esophagus was normal. Two biopsies were obtained in the       middle third of the esophagus with cold forceps for histology. A TTS       dilator was passed through the scope. Dilation with a 15-16.5-18 mm       balloon dilator was performed to 18 mm. The dilation site was examined       following endoscope reinsertion and showed complete resolution of       luminal narrowing.      The stomach was normal.      The examined duodenum was normal. Impression:            - Normal esophagus. Dilated.                        - Normal stomach.                        - Normal examined duodenum.                        - Two biopsies were obtained in the middle third of                         the esophagus. Recommendation:        - Discharge patient to home.                        - Resume previous diet.                        - Continue present medications.                        - Await pathology results. Procedure Code(s):     --- Professional ---                        [phone], Esophagogastroduodenoscopy, flexible,                         transoral; with transendoscopic balloon dilation of  esophagus (less than 30 mm diameter)                        43239, 59, Esophagogastroduodenoscopy, flexible,                         transoral; with biopsy, single or multiple Diagnosis Code(s):     --- Professional ---                        R13.10, Dysphagia, unspecified CPT copyright 2019 American Medical Association. All rights reserved. The codes documented in this report are preliminary and upon coder review may  be revised to meet current compliance requirements. Lucilla Lame MD, MD 10/30/2019 10:01:32 AM This report has been signed electronically. Number of  Addenda: 0 Note Initiated On: 10/30/2019 9:34 AM Estimated Blood Loss:  Estimated blood loss: none.      [facility]

## 2019-10-30 NOTE — Transfer of Care (Signed)
Immediate Anesthesia Transfer of Care Note  Patient: [name] [name]  Procedure(s) Performed: ESOPHAGOGASTRODUODENOSCOPY (EGD) WITH PROPOFOL (N/A )  Patient Location: Endoscopy Unit  Anesthesia Type:General  Level of Consciousness: drowsy and patient cooperative  Airway & Oxygen Therapy: Patient Spontanous Breathing and Patient connected to face mask oxygen  Post-op Assessment: Report given to RN and Post -op Vital signs reviewed and stable  Post vital signs: Reviewed and stable  Last Vitals:  Vitals Value Taken Time  BP 119/60 10/30/19 1002  Temp    Pulse 74 10/30/19 1002  Resp 17 10/30/19 1002  SpO2 100 % 10/30/19 1002  Vitals shown include unvalidated device data.  Last Pain:  Vitals:   10/30/19 0846  TempSrc: Temporal  PainSc: 0-No pain         Complications: No complications documented.

## 2019-10-30 NOTE — H&P (Signed)
Lucilla Lame, MD [address] [place], [place] [address] Phone:[phone] Fax : [phone]  Primary Care Physician:  [name], MD Primary Gastroenterologist:  Dr. Allen Norris  Pre-Procedure History & Physical: HPI:  [name] [name] is a 65 y.o. female is here for an endoscopy.   Past Medical History:  Diagnosis Date  . Allergy   . Anxiety   . GERD (gastroesophageal reflux disease)   . Hyperlipidemia   . Presence of dental prosthetic device    Implant - 1 tooth, top front  . Wears contact lenses     Past Surgical History:  Procedure Laterality Date  . ESOPHAGOGASTRODUODENOSCOPY (EGD) WITH PROPOFOL N/A 11/25/2017   Procedure: ESOPHAGOGASTRODUODENOSCOPY (EGD) WITH PROPOFOL;  Surgeon: Manya Silvas, MD;  Location: [facility] ENDOSCOPY;  Service: Endoscopy;  Laterality: N/A;  . MANDIBLE FRACTURE SURGERY      Prior to Admission medications   Medication Sig Start Date End Date Taking? Authorizing Provider  calcium citrate-vitamin D (CELEBRATE CALCIUM CITRATE) 500-500 MG-UNIT chewable tablet Chew 1 tablet by mouth daily.  03/23/19  Yes [provider]  Cyanocobalamin (VITAMIN B-12) 3000 MCG SUBL Place 3,000 mcg under the tongue daily.   Yes [provider]  dexlansoprazole (DEXILANT) 60 MG capsule Take 1 capsule (60 mg total) by mouth daily. 09/03/19  Yes Lucilla Lame, MD  raloxifene (EVISTA) 60 MG tablet TAKE 1 TABLET (60 MG TOTAL) BY MOUTH DAILY. 06/22/19  Yes [name], MD  Cholecalciferol (VITAMIN D3) 25 MCG (1000 UT) CHEW Chew 1,000 Units by mouth daily.  Patient not taking: Reported on 10/26/2019 05/11/19   [provider]  escitalopram (LEXAPRO) 5 MG tablet Take 1 tablet (5 mg total) by mouth daily. Patient not taking: Reported on 12/23/2018 09/03/18   [name], MD    Allergies as of 08/28/2019 - Review Complete 08/27/2019  Allergen Reaction Noted  . Codeine  06/07/2011  . Singulair [montelukast sodium] Rash 06/03/2013     Family History  Problem Relation Age of Onset  . Hyperlipidemia Mother   . Diabetes Mother   . COPD Mother   . Heart disease Mother        smoker  . Cancer Mother        lung CA mets   . Hyperlipidemia Father   . Heart disease Father 36       ami, smoker  . Early death Father   . Stroke Maternal Grandmother   . Stroke Sister   . COPD Sister   . Cancer Sister 83       lymphoma  . Diabetes Brother   . Breast cancer Neg Hx     Social History   Socioeconomic History  . Marital status: Married    Spouse name: Not on file  . Number of children: Not on file  . Years of education: Not on file  . Highest education level: Not on file  Occupational History  . Not on file  Tobacco Use  . Smoking status: Never Smoker  . Smokeless tobacco: Never Used  Vaping Use  . Vaping Use: Never used  Substance and Sexual Activity  . Alcohol use: No  . Drug use: Never  . Sexual activity: Not on file  Other Topics Concern  . Not on file  Social History Narrative   Lives in [place]. Works at [employer].   Social Determinants of Health   Financial Resource Strain:   . Difficulty of Paying Living Expenses:   Food Insecurity:   .  Worried About Charity fundraiser in the Last Year:   . Arboriculturist in the Last Year:   Transportation Needs:   . Film/video editor (Medical):   Marland Kitchen Lack of Transportation (Non-Medical):   Physical Activity:   . Days of Exercise per Week:   . Minutes of Exercise per Session:   Stress:   . Feeling of Stress :   Social Connections:   . Frequency of Communication with Friends and Family:   . Frequency of Social Gatherings with Friends and Family:   . Attends Religious Services:   . Active Member of Clubs or Organizations:   . Attends Archivist Meetings:   Marland Kitchen Marital Status:   Intimate Partner Violence:   . Fear of Current or Ex-Partner:   . Emotionally Abused:   Marland Kitchen Physically Abused:   . Sexually Abused:     Review of Systems: See HPI,  otherwise negative ROS  Physical Exam: BP 109/61   Pulse 79   Temp (!) 97.3 F (36.3 C) (Temporal)   Resp 16   Ht 5\' 4"  (1.626 m)   Wt 56.7 kg   LMP 11/20/2012   SpO2 98%   BMI 21.46 kg/m  General:   Alert,  pleasant and cooperative in NAD Head:  Normocephalic and atraumatic. Neck:  Supple; no masses or thyromegaly. Lungs:  Clear throughout to auscultation.    Heart:  Regular rate and rhythm. Abdomen:  Soft, nontender and nondistended. Normal bowel sounds, without guarding, and without rebound.   Neurologic:  Alert and  oriented x4;  grossly normal neurologically.  Impression/Plan: [name] [name] is here for an endoscopy to be performed for dysphagia  Risks, benefits, limitations, and alternatives regarding  endoscopy have been reviewed with the patient.  Questions have been answered.  All parties agreeable.   Lucilla Lame, MD  10/30/2019, 9:31 AM

## 2019-11-02 ENCOUNTER — Encounter: Payer: Self-pay | Admitting: Gastroenterology

## 2019-11-02 LAB — SURGICAL PATHOLOGY

## 2019-11-04 ENCOUNTER — Telehealth: Payer: Self-pay

## 2019-11-04 ENCOUNTER — Other Ambulatory Visit: Payer: Self-pay

## 2019-11-04 MED ORDER — PANTOPRAZOLE SODIUM 40 MG PO TBEC
40.0000 mg | DELAYED_RELEASE_TABLET | Freq: Every day | ORAL | 6 refills | Status: DC
Start: 2019-11-04 — End: 2019-11-04

## 2019-11-04 NOTE — Telephone Encounter (Signed)
-----   Message from Lucilla Lame, MD sent at 11/04/2019  2:12 PM EDT ----- Regarding: RE: Have her stop the dexilant and tell her to go on the protonix but take 40mg  a day. ----- Message ----- From: Glennie Isle, CMA Sent: 11/04/2019   2:09 PM EDT To: Lucilla Lame, MD  Pt called today stating she is still having diarrhea. She read that Dexilant could cause it. She has purchased liquid imodium but has not taken it. She said she spoke with you on Sunday. She would like to know what you recommend. She has Prilosec 20mg  and Pantoprazole 20mg  that she was taking before. No nausea, vomiting, fever or pain. She has been on a bland diet but is still having diarrhea.   Please advise.

## 2019-11-04 NOTE — Telephone Encounter (Signed)
Pt notified of recommendation. Pt requested a new prescription for Pantoprazole 40 mg be called into her pharmacy.

## 2019-12-10 ENCOUNTER — Telehealth: Payer: Self-pay | Admitting: Internal Medicine

## 2019-12-10 NOTE — Telephone Encounter (Signed)
Called to discuss with [name] [name] about Covid symptoms and the use of bamlanivimab, a monoclonal antibody infusion for those with mild to moderate Covid symptoms and at a high risk of hospitalization.    Pt is not qualified for this infusion due to lack of identified risk factors and co-morbid conditions.  Symptoms reviewed as well as criteria for ending isolation.  Symptoms reviewed that would warrant ED/Hospital evaluation as well should her condition worsen. Preventative practices reviewed. Patient verbalized understanding.    Patient Active Problem List   Diagnosis Date Noted  . Problems with swallowing and mastication   . Esophageal dysmotility 07/21/2019  . Anxiety about health 09/06/2018  . Cervical lymphadenopathy 09/02/2018  . Vitamin D deficiency 01/16/2017  . Varicose veins of both lower extremities 07/20/2015  . Urethral polyp 05/23/2014  . Osteoporosis 09/30/2013  . Menopause present, declines hormone replacement therapy 04/15/2012  . Allergic rhinitis due to dust 04/14/2012  . Encounter for preventive health examination 07/17/2011  . GERD (gastroesophageal reflux disease) 06/07/2011   Alan Ripper, NP [place]

## 2019-12-11 NOTE — Telephone Encounter (Signed)
Pt called about mychart message and would like a call back  She also ha a question about the raloxifene (EVISTA) 60 MG tablet and having covid

## 2019-12-11 NOTE — Telephone Encounter (Signed)
Called the patient. Era states that she has been getting better every day. She is not in any distress. She states that she has stopped taking the Evista medication due to pain when swallowing pills. She wants to make sure that this is ok to stop. Scheduled a Video Visit with Denice Paradise on 12/15/19.

## 2019-12-15 ENCOUNTER — Encounter: Payer: Self-pay | Admitting: Nurse Practitioner

## 2019-12-15 ENCOUNTER — Telehealth (INDEPENDENT_AMBULATORY_CARE_PROVIDER_SITE_OTHER): Payer: 59 | Admitting: Nurse Practitioner

## 2019-12-15 VITALS — Temp 98.0°F | Ht 64.0 in | Wt 120.0 lb

## 2019-12-15 DIAGNOSIS — U071 COVID-19: Secondary | ICD-10-CM

## 2019-12-15 DIAGNOSIS — Z8616 Personal history of COVID-19: Secondary | ICD-10-CM | POA: Insufficient documentation

## 2019-12-15 NOTE — Patient Instructions (Addendum)
Please continue with symptomatic therapy for your Covid.  Your symptoms seem to be isolated to nasal congestion.  You may use normal saline spray as needed.  You may take Allegra as needed if your nose becomes drippy.  Mucinex DM is available if you develop cough.  So far y, ou have been very fortunate that the Covid has not gone into the lungs.  Continue to monitor for worsening symptoms and call the office back if you develop cough, wheezing, chest tightness, fever.  1) Consider buying a pulse oximeter at [facility] to monitor your oxygen level.  If your oxygen level falls below 90% and stays there,  Go to the ER  For urgent assistance.    2) continue isolation for a minimum of 10 days.  All symptoms should be improving or resolved in order to break quarantine     3) you must be fever free without the use of tylenol, aleve or motrin for 72 hours  before breaking isolation  and/or returning to work .   Rest, hydrate very well, eat healthy protein food, Tylenol or Advil as directed .    10 Things You Can Do to Manage Your COVID-19 Symptoms at Home If you have possible or confirmed COVID-19: 1. Stay home from work and school. And stay away from other public places. If you must go out, avoid using any kind of public transportation, ridesharing, or taxis. 2. Monitor your symptoms carefully. If your symptoms get worse, call your healthcare provider immediately. 3. Get rest and stay hydrated. 4. If you have a medical appointment, call the healthcare provider ahead of time and tell them that you have or may have COVID-19. 5. For medical emergencies, call 911 and notify the dispatch personnel that you have or may have COVID-19. 6. Cover your cough and sneezes with a tissue or use the inside of your elbow. 7. Wash your hands often with soap and water for at least 20 seconds or clean your hands with an alcohol-based hand sanitizer that contains at least 60% alcohol. 8. As much as possible, stay in a specific  room and away from other people in your home. Also, you should use a separate bathroom, if available. If you need to be around other people in or outside of the home, wear a mask. 9. Avoid sharing personal items with other people in your household, like dishes, towels, and bedding. 10. Clean all surfaces that are touched often, like counters, tabletops, and doorknobs. Use household cleaning sprays or wipes according to the label instructions. [email or web address] 09/17/2018 This information is not intended to replace advice given to you by your health care provider. Make sure you discuss any questions you have with your health care provider. Document Revised: 02/19/2019 Document Reviewed: 02/19/2019 Elsevier Patient Education  [place].

## 2019-12-15 NOTE — Progress Notes (Signed)
Virtual Visit via video note  This visit type was conducted due to national recommendations for restrictions regarding the COVID-19 pandemic (e.g. social distancing).  This format is felt to be most appropriate for this patient at this time.  All issues noted in this document were discussed and addressed.  No physical exam was performed (except for noted visual exam findings with Video Visits).   I connected with@ on 12/15/19 at  8:00 AM EDT by a video enabled telemedicine application or telephone and verified that I am speaking with the correct person using two identifiers. Location patient: home Location provider: work or home office Persons participating in the virtual visit: patient, provider  I discussed the limitations, risks, security and privacy concerns of performing an evaluation and management service by telephone and the availability of in person appointments. I also discussed with the patient that there may be a patient responsible charge related to this service. The patient expressed understanding and agreed to proceed.    Reason for visit: Diagnosed with Covid on 12/08/2019  HPI: This healthy 64 year old female developed Covid type symptoms of nasal congestion, loss of taste and smell 10 days ago.  She was diagnosed with positive Covid test on 12/08/2019.  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  Her symptoms have remained nasal congestion, loss of taste and smell, a little bit of indigestion.  She initially felt miserable but that quickly resolved.  She has had no  cough and it has not affected her lungs. Every day she is feeling better.  She is cleaning her house and maintaining normal ADLs.  She has noted no fever, cough, wheeze, chest pain, tightness, dizziness, lightheadedness.  No unusual fatigue or weakness mentioned.  She is eating and drinking well although she is doing it out of hunger, not enjoyment.  She can only takes chocolate.  She  has been using normal saline nasal spray occasionally, and  took a couple doses of Allegra when it first started.  She does have allergic rhinitis.  She has in quarantine until 12/17/2019.  She works from home.   ROS: See pertinent positives and negatives per HPI.  Past Medical History:  Diagnosis Date  . Allergy   . Anxiety   . GERD (gastroesophageal reflux disease)   . Hyperlipidemia   . Presence of dental prosthetic device    Implant - 1 tooth, top front  . Wears contact lenses     Past Surgical History:  Procedure Laterality Date  . ESOPHAGOGASTRODUODENOSCOPY (EGD) WITH PROPOFOL N/A 11/25/2017   Procedure: ESOPHAGOGASTRODUODENOSCOPY (EGD) WITH PROPOFOL;  Surgeon: Manya Silvas, MD;  Location: [facility] ENDOSCOPY;  Service: Endoscopy;  Laterality: N/A;  . ESOPHAGOGASTRODUODENOSCOPY (EGD) WITH PROPOFOL N/A 10/30/2019   Procedure: ESOPHAGOGASTRODUODENOSCOPY (EGD) WITH PROPOFOL;  Surgeon: Lucilla Lame, MD;  Location: [facility] ENDOSCOPY;  Service: Endoscopy;  Laterality: N/A;  . MANDIBLE FRACTURE SURGERY      Family History  Problem Relation Age of Onset  . Hyperlipidemia Mother   . Diabetes Mother   . COPD Mother   . Heart disease Mother        smoker  . Cancer Mother        lung CA mets   . Hyperlipidemia Father   . Heart disease Father 86       ami, smoker  . Early death Father   . Stroke Maternal Grandmother   . Stroke Sister   . COPD Sister   . Cancer  Sister 56       lymphoma  . Diabetes Brother   . Breast cancer Neg Hx     SOCIAL HX: Never smoked   Current Outpatient Medications:  .  calcium citrate-vitamin D (CELEBRATE CALCIUM CITRATE) 500-500 MG-UNIT chewable tablet, Chew 1 tablet by mouth daily. , Disp: , Rfl:  .  Cyanocobalamin (VITAMIN B-12) 3000 MCG SUBL, Place 3,000 mcg under the tongue daily., Disp: , Rfl:  .  pantoprazole (PROTONIX) 40 MG tablet, Take 1 tablet (40 mg total) by mouth daily., Disp: 30 tablet, Rfl: 6 .  raloxifene (EVISTA) 60 MG tablet, TAKE 1  TABLET (60 MG TOTAL) BY MOUTH DAILY., Disp: 90 tablet, Rfl: 5  EXAM:  VITALS per patient if applicable: Weight 702 temp 98.0  GENERAL: alert, oriented, appears well and in no acute distress  HEENT: atraumatic, conjunctiva clear, no obvious abnormalities on inspection of external nose and ears.  Voice is hoarse  NECK: normal movements of the head and neck  LUNGS: on inspection no signs of respiratory distress, breathing rate appears normal, no obvious gross SOB, gasping or wheezing.  No coughing  CV: no obvious cyanosis  MS: moves all visible extremities without noticeable abnormality  PSYCH/NEURO: pleasant and cooperative, no obvious depression or anxiety, speech and thought processing grossly intact  ASSESSMENT AND PLAN:  Discussed the following assessment and plan:  COVID-19 virus infection  No problem-specific Assessment & Plan notes found for this encounter. Please continue with symptomatic therapy for your Covid.  Your symptoms seem to be isolated to nasal congestion.  You may use normal saline spray as needed.  You may take Allegra as needed if your nose becomes drippy.  Mucinex DM is available if you develop cough.  So far y, ou have been very fortunate that the Covid has not gone into the lungs.  Continue to monitor for worsening symptoms and call the office back if you develop cough, wheezing, chest tightness, fever.  1) Consider buying a pulse oximeter at [facility] to monitor your oxygen level.  If your oxygen level falls below 90% and stays there,  Go to the ER  For urgent assistance.    2) continue isolation for a minimum of 10 days.  All symptoms should be improving or resolved in order to break quarantine     3) you must be fever free without the use of tylenol, aleve or motrin for 72 hours  before breaking isolation  and/or returning to work .   Rest, hydrate very well, eat healthy protein food, Tylenol or Advil as directed .    I discussed the assessment and treatment  plan with the patient. The patient was provided an opportunity to ask questions and all were answered. The patient agreed with the plan and demonstrated an understanding of the instructions.   The patient was advised to call back or seek an in-person evaluation if the symptoms worsen or if the condition fails to improve as anticipated.  Denice Paradise, NP Adult Nurse Practitioner [place] [phone]

## 2019-12-17 ENCOUNTER — Telehealth: Payer: 59 | Admitting: Internal Medicine

## 2019-12-17 ENCOUNTER — Telehealth: Payer: Self-pay | Admitting: Internal Medicine

## 2019-12-17 NOTE — Telephone Encounter (Signed)
Will repeat next year 

## 2019-12-17 NOTE — Telephone Encounter (Signed)
Patient had a bone density last year and her insurance will not cover another one at this time, unless there is a medical reason. Insurance company will need a diagnoses code from Dr. Derrel Nip.

## 2019-12-17 NOTE — Telephone Encounter (Signed)
Spoke with pt to let her know that we will just reorder it again for next year. Pt gave a verbal understanding.

## 2019-12-18 DEATH — deceased

## 2020-01-04 ENCOUNTER — Other Ambulatory Visit: Payer: Self-pay

## 2020-01-04 ENCOUNTER — Ambulatory Visit (INDEPENDENT_AMBULATORY_CARE_PROVIDER_SITE_OTHER): Payer: 59

## 2020-01-04 DIAGNOSIS — Z23 Encounter for immunization: Secondary | ICD-10-CM

## 2020-01-14 DIAGNOSIS — R070 Pain in throat: Secondary | ICD-10-CM | POA: Diagnosis not present

## 2020-01-14 DIAGNOSIS — R1310 Dysphagia, unspecified: Secondary | ICD-10-CM | POA: Diagnosis not present

## 2020-01-27 ENCOUNTER — Other Ambulatory Visit: Payer: Self-pay

## 2020-01-27 ENCOUNTER — Other Ambulatory Visit: Payer: Self-pay | Admitting: Internal Medicine

## 2020-01-27 ENCOUNTER — Other Ambulatory Visit (INDEPENDENT_AMBULATORY_CARE_PROVIDER_SITE_OTHER): Payer: 59

## 2020-01-27 DIAGNOSIS — Z23 Encounter for immunization: Secondary | ICD-10-CM | POA: Diagnosis not present

## 2020-01-28 LAB — SARS-COV-2 SEMI-QUANTITATIVE TOTAL ANTIBODY, SPIKE
SARS-CoV-2 Semi-Quant Total Ab: >2500 U/mL (ref <0.8)
SARS-CoV-2 Spike Ab Interp: POSITIVE

## 2020-01-29 NOTE — Progress Notes (Signed)
You have a very robust COVID antibody titre. This is a good thing!   Regards,   Deborra Medina, MD

## 2020-02-03 ENCOUNTER — Other Ambulatory Visit: Payer: Self-pay

## 2020-02-03 ENCOUNTER — Ambulatory Visit
Admission: RE | Admit: 2020-02-03 | Discharge: 2020-02-03 | Disposition: A | Discharge status: Home / Self Care | Payer: 59 | Source: Ambulatory Visit | Attending: Internal Medicine | Admitting: Internal Medicine

## 2020-02-03 DIAGNOSIS — Z1231 Encounter for screening mammogram for malignant neoplasm of breast: Secondary | ICD-10-CM | POA: Diagnosis not present

## 2020-02-08 ENCOUNTER — Other Ambulatory Visit: Payer: Self-pay

## 2020-02-08 ENCOUNTER — Other Ambulatory Visit: Payer: Self-pay | Admitting: Internal Medicine

## 2020-02-08 DIAGNOSIS — R928 Other abnormal and inconclusive findings on diagnostic imaging of breast: Secondary | ICD-10-CM

## 2020-02-08 DIAGNOSIS — N6489 Other specified disorders of breast: Secondary | ICD-10-CM

## 2020-02-08 MED ORDER — PANTOPRAZOLE SODIUM 40 MG PO TBEC
40.0000 mg | DELAYED_RELEASE_TABLET | Freq: Every day | ORAL | 11 refills | Status: DC
Start: 2020-02-08 — End: 2020-08-16

## 2020-02-08 NOTE — Progress Notes (Signed)
Your  mammogram was reported to me as asymmetric on the  right  side.  The radiologist is recommending additional images , and they will contact you directly to get those scheduled.  Please do not panic!  This happens quite frequently and more often in women with dense breasts., and DOES NOT mean you have Cancer!   The tone of the message you may receive from the facility is unfortunately meant to ensure that you return for the additional images.  If you do not hear from them by next Monday , please call my office  and I will get involved.   Regards,   Deborra Medina, MD

## 2020-02-22 ENCOUNTER — Ambulatory Visit
Admission: RE | Admit: 2020-02-22 | Discharge: 2020-02-22 | Disposition: A | Discharge status: Home / Self Care | Payer: 59 | Source: Ambulatory Visit | Attending: Internal Medicine | Admitting: Internal Medicine

## 2020-02-22 ENCOUNTER — Other Ambulatory Visit: Payer: Self-pay

## 2020-02-22 DIAGNOSIS — N6489 Other specified disorders of breast: Secondary | ICD-10-CM | POA: Diagnosis not present

## 2020-02-22 DIAGNOSIS — R928 Other abnormal and inconclusive findings on diagnostic imaging of breast: Secondary | ICD-10-CM | POA: Diagnosis not present

## 2020-02-22 DIAGNOSIS — N6311 Unspecified lump in the right breast, upper outer quadrant: Secondary | ICD-10-CM | POA: Diagnosis not present

## 2020-03-23 DIAGNOSIS — H524 Presbyopia: Secondary | ICD-10-CM | POA: Diagnosis not present

## 2020-06-03 ENCOUNTER — Other Ambulatory Visit: Payer: Self-pay | Admitting: Internal Medicine

## 2020-06-03 ENCOUNTER — Ambulatory Visit: Payer: Self-pay | Attending: Internal Medicine

## 2020-06-03 DIAGNOSIS — Z23 Encounter for immunization: Secondary | ICD-10-CM

## 2020-06-03 NOTE — Progress Notes (Signed)
   Covid-19 Vaccination Clinic  Name:  [name] [name]    MRN: [id number] DOB: 10/19/1955  06/03/2020  [name] was observed post Covid-19 immunization for 15 minutes without incident. She was provided with Vaccine Information Sheet and instruction to access the V-Safe system.   [name] was instructed to call 911 with any severe reactions post vaccine: Marland Kitchen Difficulty breathing  . Swelling of face and throat  . A fast heartbeat  . A bad rash all over body  . Dizziness and weakness   Immunizations Administered    Name Date Dose VIS Date Route   PFIZER Comrnaty(Gray TOP) Covid-19 Vaccine 06/03/2020 10:21 AM 0.3 mL 02/25/2020 Intramuscular   Manufacturer: [employer], [employer]   Lot: [id number]   [place]: [phone]

## 2020-06-24 ENCOUNTER — Other Ambulatory Visit: Payer: Self-pay | Admitting: Internal Medicine

## 2020-06-24 ENCOUNTER — Other Ambulatory Visit: Payer: Self-pay

## 2020-06-24 MED ORDER — RALOXIFENE HCL 60 MG PO TABS
ORAL_TABLET | Freq: Every day | ORAL | 5 refills | Status: DC
  Filled 2020-06-24: qty 90, 90d supply, fill #0
  Filled 2020-10-28: qty 90, 90d supply, fill #1
  Filled 2021-02-17: qty 90, 90d supply, fill #2
  Filled 2021-05-18: qty 90, 90d supply, fill #3

## 2020-06-30 ENCOUNTER — Other Ambulatory Visit: Payer: Self-pay

## 2020-06-30 MED FILL — Pantoprazole Sodium For Delayed Release Susp Packet 40 MG: ORAL | 30 days supply | Qty: 30 | Fill #0 | Status: AC

## 2020-07-01 ENCOUNTER — Other Ambulatory Visit: Payer: Self-pay

## 2020-08-02 ENCOUNTER — Other Ambulatory Visit: Payer: Self-pay

## 2020-08-02 MED FILL — Pantoprazole Sodium For Delayed Release Susp Packet 40 MG: ORAL | 30 days supply | Qty: 30 | Fill #1 | Status: AC

## 2020-08-03 ENCOUNTER — Other Ambulatory Visit: Payer: Self-pay

## 2020-08-16 ENCOUNTER — Encounter: Payer: Self-pay | Admitting: Gastroenterology

## 2020-08-16 ENCOUNTER — Ambulatory Visit: Payer: 59 | Admitting: Gastroenterology

## 2020-08-16 ENCOUNTER — Other Ambulatory Visit: Payer: Self-pay

## 2020-08-16 VITALS — BP 111/56 | HR 86 | Ht 64.0 in | Wt 129.6 lb

## 2020-08-16 DIAGNOSIS — R1313 Dysphagia, pharyngeal phase: Secondary | ICD-10-CM

## 2020-08-16 DIAGNOSIS — R198 Other specified symptoms and signs involving the digestive system and abdomen: Secondary | ICD-10-CM | POA: Diagnosis not present

## 2020-08-16 DIAGNOSIS — R0989 Other specified symptoms and signs involving the circulatory and respiratory systems: Secondary | ICD-10-CM

## 2020-08-16 NOTE — Progress Notes (Signed)
Primary Care Physician: [name], MD  Primary Gastroenterologist:  Dr. Lucilla Lame  Chief Complaint  Patient presents with  . globus sensation    HPI: [name] [name] is a 64 y.o. female here with continued globus sensation despite having an EGD without any abnormalities found.  The patient states that she is unable to take pills and has been taking granular pantoprazole. She does not have issues with bulky things like steak chicken and bread but feels like there is something stuck in her throat most the time.  The patient was seen by ENT who reported everything to be normal.  The patient had empirical dilation of her esophagus with an 18 mm balloon with no improvement in her symptoms.  There is no worrisome symptoms such as nausea vomiting black stools or bloody stools.  She also denies any unexplained weight loss.  Past Medical History:  Diagnosis Date  . Allergy   . Anxiety   . GERD (gastroesophageal reflux disease)   . Hyperlipidemia   . Presence of dental prosthetic device    Implant - 1 tooth, top front  . Wears contact lenses     Current Outpatient Medications  Medication Sig Dispense Refill  . calcium citrate-vitamin D (CELEBRATE CALCIUM CITRATE) 500-500 MG-UNIT chewable tablet Chew 1 tablet by mouth daily.     Marland Kitchen COVID-19 mRNA Vac-TriS, Pfizer, SUSP injection USE AS DIRECTED .3 mL 0  . Cyanocobalamin (VITAMIN B-12) 3000 MCG SUBL Place 3,000 mcg under the tongue daily.    . pantoprazole sodium (PROTONIX) 40 mg/20 mL PACK MIX 1 PACKET WITH APPLESAUCE AND TAKE BY MOUTH ONCE DAILY 30 mL 11  . raloxifene (EVISTA) 60 MG tablet TAKE 1 TABLET (60 MG TOTAL) BY MOUTH DAILY. 90 tablet 5   No current facility-administered medications for this visit.    Allergies as of 08/16/2020 - Review Complete 08/16/2020  Allergen Reaction Noted  . Citrus Nausea And Vomiting 10/26/2019  . Codeine Other (See Comments) 06/07/2011  . Singulair [montelukast sodium] Rash 06/03/2013     ROS:  General: Negative for anorexia, weight loss, fever, chills, fatigue, weakness. ENT: Negative for hoarseness, difficulty swallowing , nasal congestion. CV: Negative for chest pain, angina, palpitations, dyspnea on exertion, peripheral edema.  Respiratory: Negative for dyspnea at rest, dyspnea on exertion, cough, sputum, wheezing.  GI: See history of present illness. GU:  Negative for dysuria, hematuria, urinary incontinence, urinary frequency, nocturnal urination.  Endo: Negative for unusual weight change.    Physical Examination:   BP (!) 111/56   Pulse 86   Ht 5\' 4"  (1.626 m)   Wt 129 lb 9.6 oz (58.8 kg)   LMP 11/20/2012   BMI 22.25 kg/m   General: Well-nourished, well-developed in no acute distress.  Eyes: No icterus. Conjunctivae pink. Neuro: Alert and oriented x 3.  Grossly intact. Skin: Warm and dry, no jaundice.   Psych: Alert and cooperative, normal mood and affect.  Labs:    Imaging Studies: No results found.  Assessment and Plan:   [name] [name] is a 65 y.o. y/o female Who comes in with continued dysphagia and a globus sensation.  The patient has been told that her EGD and dilation did not improve her symptoms and appeared normal.  She is also informed that a motility issue/reflux can cause some of her symptoms therefore she will be set up for a esophageal manometry with pH studies and impedance.  This will rule in or rule out the esophagus and reflux  as the cause of her symptoms.  The patient has been explained the plan and agrees with it.     Lucilla Lame, MD. Marval Regal    Note: This dictation was prepared with Dragon dictation along with smaller phrase technology. Any transcriptional errors that result from this process are unintentional.

## 2020-08-31 ENCOUNTER — Other Ambulatory Visit: Payer: Self-pay

## 2020-08-31 DIAGNOSIS — Z0000 Encounter for general adult medical examination without abnormal findings: Secondary | ICD-10-CM

## 2020-09-02 ENCOUNTER — Other Ambulatory Visit: Payer: Self-pay

## 2020-09-02 MED FILL — Pantoprazole Sodium For Delayed Release Susp Packet 40 MG: ORAL | 30 days supply | Qty: 30 | Fill #2 | Status: AC

## 2020-09-05 ENCOUNTER — Other Ambulatory Visit: Payer: Self-pay

## 2020-09-05 ENCOUNTER — Other Ambulatory Visit (INDEPENDENT_AMBULATORY_CARE_PROVIDER_SITE_OTHER): Payer: 59

## 2020-09-05 DIAGNOSIS — Z0000 Encounter for general adult medical examination without abnormal findings: Secondary | ICD-10-CM

## 2020-09-05 LAB — LIPID PANEL
Cholesterol: 166 mg/dL (ref 0–200)
HDL: 78.6 mg/dL (ref >39.00)
LDL Cholesterol: 76 mg/dL (ref 0–99)
NonHDL: 87.77
Total CHOL/HDL Ratio: 2
Triglycerides: 60 mg/dL (ref 0.0–149.0)
VLDL: 12 mg/dL (ref 0.0–40.0)

## 2020-09-05 LAB — COMPREHENSIVE METABOLIC PANEL
ALT: 11 U/L (ref 0–35)
AST: 18 U/L (ref 0–37)
Albumin: 3.8 g/dL (ref 3.5–5.2)
Alkaline Phosphatase: 55 U/L (ref 39–117)
BUN: 22 mg/dL (ref 6–23)
CO2: 29 mEq/L (ref 19–32)
Calcium: 9.1 mg/dL (ref 8.4–10.5)
Chloride: 103 mEq/L (ref 96–112)
Creatinine, Ser: 0.55 mg/dL (ref 0.40–1.20)
GFR: 97.49 mL/min (ref >60.00)
Glucose, Bld: 81 mg/dL (ref 70–99)
Potassium: 4.2 mEq/L (ref 3.5–5.1)
Sodium: 139 mEq/L (ref 135–145)
Total Bilirubin: 0.4 mg/dL (ref 0.2–1.2)
Total Protein: 6.9 g/dL (ref 6.0–8.3)

## 2020-09-05 LAB — CBC WITH DIFFERENTIAL/PLATELET
Basophils Absolute: 0 10*3/uL (ref 0.0–0.1)
Basophils Relative: 0.7 % (ref 0.0–3.0)
Eosinophils Absolute: 0.1 10*3/uL (ref 0.0–0.7)
Eosinophils Relative: 2.2 % (ref 0.0–5.0)
HCT: 37.9 % (ref 36.0–46.0)
Hemoglobin: 12.4 g/dL (ref 12.0–15.0)
Lymphocytes Relative: 33.8 % (ref 12.0–46.0)
Lymphs Abs: 1.6 10*3/uL (ref 0.7–4.0)
MCHC: 32.6 g/dL (ref 30.0–36.0)
MCV: 87.5 fl (ref 78.0–100.0)
Monocytes Absolute: 0.6 10*3/uL (ref 0.1–1.0)
Monocytes Relative: 12.7 % — ABNORMAL HIGH (ref 3.0–12.0)
Neutro Abs: 2.4 10*3/uL (ref 1.4–7.7)
Neutrophils Relative %: 50.6 % (ref 43.0–77.0)
Platelets: 197 10*3/uL (ref 150.0–400.0)
RBC: 4.33 Mil/uL (ref 3.87–5.11)
RDW: 14.2 % (ref 11.5–15.5)
WBC: 4.7 10*3/uL (ref 4.0–10.5)

## 2020-09-05 LAB — TSH: TSH: 1.82 u[IU]/mL (ref 0.35–4.50)

## 2020-09-05 LAB — VITAMIN D 25 HYDROXY (VIT D DEFICIENCY, FRACTURES): VITD: 44.79 ng/mL (ref 30.00–100.00)

## 2020-09-08 ENCOUNTER — Ambulatory Visit (INDEPENDENT_AMBULATORY_CARE_PROVIDER_SITE_OTHER): Payer: 59 | Admitting: Internal Medicine

## 2020-09-08 ENCOUNTER — Other Ambulatory Visit: Payer: Self-pay

## 2020-09-08 ENCOUNTER — Encounter: Payer: Self-pay | Admitting: Internal Medicine

## 2020-09-08 ENCOUNTER — Other Ambulatory Visit (HOSPITAL_COMMUNITY)
Admission: RE | Admit: 2020-09-08 | Discharge: 2020-09-08 | Disposition: A | Discharge status: Home / Self Care | Payer: 59 | Source: Ambulatory Visit | Attending: Internal Medicine | Admitting: Internal Medicine

## 2020-09-08 VITALS — BP 108/64 | HR 99 | Temp 96.6°F | Resp 16 | Ht 64.0 in | Wt 126.8 lb

## 2020-09-08 DIAGNOSIS — M810 Age-related osteoporosis without current pathological fracture: Secondary | ICD-10-CM

## 2020-09-08 DIAGNOSIS — J3089 Other allergic rhinitis: Secondary | ICD-10-CM | POA: Diagnosis not present

## 2020-09-08 DIAGNOSIS — Z124 Encounter for screening for malignant neoplasm of cervix: Secondary | ICD-10-CM | POA: Diagnosis not present

## 2020-09-08 DIAGNOSIS — F418 Other specified anxiety disorders: Secondary | ICD-10-CM

## 2020-09-08 DIAGNOSIS — N362 Urethral caruncle: Secondary | ICD-10-CM | POA: Diagnosis not present

## 2020-09-08 DIAGNOSIS — Z0000 Encounter for general adult medical examination without abnormal findings: Secondary | ICD-10-CM

## 2020-09-08 LAB — URINALYSIS, ROUTINE W REFLEX MICROSCOPIC
Bilirubin Urine: NEGATIVE
Hgb urine dipstick: NEGATIVE
Ketones, ur: NEGATIVE
Leukocytes,Ua: NEGATIVE
Nitrite: NEGATIVE
RBC / HPF: NONE SEEN (ref 0–?)
Specific Gravity, Urine: 1.01 (ref 1.000–1.030)
Total Protein, Urine: NEGATIVE
Urine Glucose: NEGATIVE
Urobilinogen, UA: 0.2 (ref 0.0–1.0)
WBC, UA: NONE SEEN (ref 0–?)
pH: 7 (ref 5.0–8.0)

## 2020-09-08 MED ORDER — IPRATROPIUM BROMIDE 0.03 % NA SOLN
2.0000 | Freq: Two times a day (BID) | NASAL | 0 refills | Status: DC
  Filled 2020-09-08: qty 30, 30d supply, fill #0

## 2020-09-08 NOTE — Progress Notes (Signed)
Patient ID: [name] [name] [name], female    DOB: 1955-08-19  Age: 65 y.o. MRN: [id number]  The patient is here for annual preventive examination and management of other chronic and acute problems.   The risk factors are reflected in the social history.  The roster of all physicians providing medical care to patient - is listed in the Snapshot section of the chart.  Activities of daily living:  The patient is 100% independent in all ADLs: dressing, toileting, feeding as well as independent mobility  Home safety : The patient has smoke detectors in the home. They wear seatbelts.  There are no firearms at home. There is no violence in the home.   There is no risks for hepatitis, STDs or HIV. There is no   history of blood transfusion. They have no travel history to infectious disease endemic areas of the world.  The patient has seen their dentist in the last six month. They have seen their eye doctor in the last year. She denies hearing difficulty with regard to whispered voices and some television programs.  They have deferred audiologic testing in the last year.  They do not  have excessive sun exposure. Discussed the need for sun protection: hats, long sleeves and use of sunscreen if there is significant sun exposure.   Diet: the importance of a healthy diet is discussed. They do have a healthy diet.  The benefits of regular aerobic exercise were discussed. She walks  on a treadmill daily for one mile.    Depression screen: there are no signs or vegative symptoms of depression- irritability, change in appetite, anhedonia, sadness/tearfullness.  Cognitive assessment: the patient manages all their financial and personal affairs and is actively engaged.  The following portions of the patient's history were reviewed and updated as appropriate: allergies, current medications, past family history, past medical history,  past surgical history, past social history  and problem list.  Visual acuity was not  assessed per patient preference since she has regular follow up with her ophthalmologist. Hearing and body mass index were assessed and reviewed.   During the course of the visit the patient was educated and counseled about appropriate screening and preventive services including : fall prevention , diabetes screening, nutrition counseling, colorectal cancer screening, and recommended immunizations.    CC: The primary encounter diagnosis was Urethral caruncle. Diagnoses of Encounter for preventive health examination, Age-related osteoporosis without current pathological fracture, Cervical cancer screening, Allergic rhinitis due to dust, Anxiety about health, and Urethral polyp were also pertinent to this visit.  1) She has been taking Evista for osteoporosis , but has pill dysphagia and misses 1-2 doses per week ,.  2) Dysphagia:  etiology appears to be functional.  She has GI And ENT evaluations  and has no sign of stricture She does not have dysphagia for solids,  just pills.   no stricture per Dr Allen Norris  he has recommended manometry offered but deferred by patient at this poitn still considering going to [facility]. Taking protonix packets mixed in apple sauce   3) anxiety about health:  never tried the SSRI due to fear of side effects   [name] [name] has a past medical history of Allergy, Anxiety, GERD (gastroesophageal reflux disease), Hyperlipidemia, Presence of dental prosthetic device, and Wears contact lenses.   She has a past surgical history that includes Mandible fracture surgery; Esophagogastroduodenoscopy (egd) with propofol (N/A, 11/25/2017); and Esophagogastroduodenoscopy (egd) with propofol (N/A, 10/30/2019).   Her family history includes Breast cancer in  her maternal aunt; COPD in her mother and sister; Cancer in her mother; Cancer (age of onset: 24) in her sister; Diabetes in her brother and mother; Early death in her father; Heart disease in her mother; Heart disease (age of onset: 74) in  her father; Hyperlipidemia in her father and mother; Stroke in her maternal grandmother and sister.She reports that she has never smoked. She has never used smokeless tobacco. She reports that she does not drink alcohol and does not use drugs.  Outpatient Medications Prior to Visit  Medication Sig Dispense Refill   calcium citrate-vitamin D (CELEBRATE CALCIUM CITRATE) 500-500 MG-UNIT chewable tablet Chew 1 tablet by mouth daily.      cholecalciferol (VITAMIN D3) 25 MCG (1000 UNIT) tablet Take 1,000 Units by mouth daily.     Cyanocobalamin (VITAMIN B-12) 3000 MCG SUBL Place 3,000 mcg under the tongue daily.     pantoprazole sodium (PROTONIX) 40 mg/20 mL PACK MIX 1 PACKET WITH APPLESAUCE AND TAKE BY MOUTH ONCE DAILY 30 mL 11   raloxifene (EVISTA) 60 MG tablet TAKE 1 TABLET (60 MG TOTAL) BY MOUTH DAILY. 90 tablet 5   COVID-19 mRNA Vac-TriS, Pfizer, SUSP injection USE AS DIRECTED (Patient not taking: Reported on 09/08/2020) .3 mL 0   No facility-administered medications prior to visit.    Review of Systems  Patient denies headache, fevers, malaise, unintentional weight loss, skin rash, eye pain, sinus congestion and sinus pain, sore throat, dysphagia,  hemoptysis , cough, dyspnea, wheezing, chest pain, palpitations, orthopnea, edema, abdominal pain, nausea, melena, diarrhea, constipation, flank pain, dysuria, hematuria, urinary  Frequency, nocturia, numbness, tingling, seizures,  Focal weakness, Loss of consciousness,  Tremor, insomnia, depression, anxiety, and suicidal ideation.     Objective:  BP 108/64 (BP Location: Left Arm, Patient Position: Sitting, Cuff Size: Normal)   Pulse 99   Temp (!) 96.6 F (35.9 C) (Temporal)   Resp 16   Ht 5\' 4"  (1.626 m)   Wt 126 lb 12.8 oz (57.5 kg)   LMP 11/20/2012   SpO2 98%   BMI 21.77 kg/m   Physical Exam  General Appearance:    Alert, cooperative, no distress, appears stated age  Head:    Normocephalic, without obvious abnormality, atraumatic   Eyes:    PERRL, conjunctiva/corneas clear, EOM's intact, fundi    benign, both eyes  Ears:    Normal TM's and external ear canals, both ears  Nose:   Nares normal, septum midline, mucosa normal, no drainage    or sinus tenderness  Throat:   Lips, mucosa, and tongue normal; teeth and gums normal  Neck:   Supple, symmetrical, trachea midline, no adenopathy;    thyroid:  no enlargement/tenderness/nodules; no carotid   bruit or JVD  Back:     Symmetric, no curvature, ROM normal, no CVA tenderness  Lungs:     Clear to auscultation bilaterally, respirations unlabored  Chest Wall:    No tenderness or deformity   Heart:    Regular rate and rhythm, S1 and S2 normal, no murmur, rub   or gallop  Breast Exam:    No tenderness, masses, or nipple abnormality  Abdomen:     Soft, non-tender, bowel sounds active all four quadrants,    no masses, no organomegaly  Genitalia:    Pelvic: urethral polyp , cervix normal in appearance, external genitalia normal, no adnexal masses or tenderness, no cervical motion tenderness, rectovaginal septum normal, uterus normal size, shape, and consistency and vagina normal without discharge  Extremities:  Extremities normal, atraumatic, no cyanosis or edema  Pulses:   2+ and symmetric all extremities  Skin:   Skin color, texture, turgor normal, no rashes or lesions  Lymph nodes:   Cervical, supraclavicular, and axillary nodes normal  Neurologic:   CNII-XII intact, normal strength, sensation and reflexes    throughout     Assessment & Plan:   Problem List Items Addressed This Visit       Unprioritized   Allergic rhinitis due to dust    Discussed increasing dose of fexofenadine syrup to 60 mg bid       Anxiety about health    Sheacknowledges her  pervaidng sense of anxiety about her health but defers trial of SSRI       Encounter for preventive health examination    age appropriate education and counseling updated, referrals for preventative services and  immunizations addressed, dietary and smoking counseling addressed, most recent labs reviewed.  I have personally reviewed and have noted:   1) the patient's medical and social history 2) The pt's use of alcohol, tobacco, and illicit drugs 3) The patient's current medications and supplements 4) Functional ability including ADL's, fall risk, home safety risk, hearing and visual impairment 5) Diet and physical activities 6) Evidence for depression or mood disorder 7) The patient's height, weight, and BMI have been recorded in the chart   I have made referrals, and provided counseling and education based on review of the above       Osteoporosis    Managed with Evista since 2020 with some difficulty due to pill dysphagia . Repeat DEXA due this August        Relevant Medications   cholecalciferol (VITAMIN D3) 25 MCG (1000 UNIT) tablet   Urethral polyp    Referring to Urology for evaluation        Relevant Orders   Ambulatory referral to Urology   Other Visit Diagnoses     Urethral caruncle    -  Primary   Relevant Orders   Urinalysis, Routine w reflex microscopic (Completed)   Cervical cancer screening       Relevant Orders   Cytology - PAP       I have discontinued [name] [name] Spates. [name]'s COVID-19 mRNA Vac-TriS [employer]). I am also having her start on ipratropium. Additionally, I am having her maintain her Celebrate Calcium Citrate, Vitamin B-12, pantoprazole sodium, raloxifene, and cholecalciferol.  Meds ordered this encounter  Medications   ipratropium (ATROVENT) 0.03 % nasal spray    Sig: Place 2 sprays into both nostrils every 12 (twelve) hours.    Dispense:  30 mL    Refill:  0    Medications Discontinued During This Encounter  Medication Reason   COVID-19 mRNA Vac-TriS, Pfizer, SUSP injection Completed Course    Follow-up: No follow-ups on file.   [name], MD

## 2020-09-08 NOTE — Assessment & Plan Note (Signed)
Sheacknowledges her  pervaidng sense of anxiety about her health but defers trial of SSRI

## 2020-09-08 NOTE — Patient Instructions (Signed)
Try taking your raloxifene with milk to ease its passing   If this doesn't help,  we can talk about switching to Prolia

## 2020-09-08 NOTE — Assessment & Plan Note (Signed)
Managed with Evista since 2020 with some difficulty due to pill dysphagia . Repeat DEXA due this August

## 2020-09-08 NOTE — Assessment & Plan Note (Signed)
Referring to Urology for evaluation

## 2020-09-08 NOTE — Assessment & Plan Note (Signed)

## 2020-09-08 NOTE — Assessment & Plan Note (Signed)
Discussed increasing dose of fexofenadine syrup to 60 mg bid

## 2020-09-09 LAB — CYTOLOGY - PAP
Comment: NEGATIVE
Diagnosis: NEGATIVE
High risk HPV: NEGATIVE

## 2020-09-27 ENCOUNTER — Other Ambulatory Visit: Payer: Self-pay

## 2020-09-29 ENCOUNTER — Other Ambulatory Visit: Payer: Self-pay

## 2020-09-29 ENCOUNTER — Encounter: Payer: Self-pay | Admitting: Urology

## 2020-09-29 ENCOUNTER — Ambulatory Visit: Payer: 59 | Admitting: Urology

## 2020-09-29 VITALS — BP 123/77 | HR 84 | Ht 64.0 in | Wt 129.0 lb

## 2020-09-29 DIAGNOSIS — N362 Urethral caruncle: Secondary | ICD-10-CM | POA: Diagnosis not present

## 2020-09-29 LAB — URINALYSIS, COMPLETE
Bilirubin, UA: NEGATIVE
Glucose, UA: NEGATIVE
Ketones, UA: NEGATIVE
Leukocytes,UA: NEGATIVE
Nitrite, UA: NEGATIVE
Protein,UA: NEGATIVE
RBC, UA: NEGATIVE
Specific Gravity, UA: 1.01 (ref 1.005–1.030)
Urobilinogen, Ur: 0.2 mg/dL (ref 0.2–1.0)
pH, UA: 7 (ref 5.0–7.5)

## 2020-09-29 LAB — MICROSCOPIC EXAMINATION
Bacteria, UA: NONE SEEN
Epithelial Cells (non renal): NONE SEEN /hpf (ref 0–10)
RBC, Urine: NONE SEEN /hpf (ref 0–2)
WBC, UA: NONE SEEN /hpf (ref 0–5)

## 2020-09-29 MED ORDER — ESTRADIOL 0.1 MG/GM VA CREA
TOPICAL_CREAM | VAGINAL | 12 refills | Status: DC
  Filled 2020-09-29: qty 42.5, 90d supply, fill #0
  Filled 2021-09-21: qty 42.5, 90d supply, fill #1

## 2020-09-29 MED FILL — Pantoprazole Sodium For Delayed Release Susp Packet 40 MG: ORAL | 30 days supply | Qty: 30 | Fill #3 | Status: CN

## 2020-09-29 NOTE — Patient Instructions (Addendum)
Estrogen Cream Instruction  Discard applicator  Apply pea sized amount to tip of finger to urethra before bed. Wash hands well after application. Use Monday, Wednesday and Friday  A urethral caruncle is a benign outgrowth at the urethral meatus (urethral opening). They occur most commonly in postmenopausal women. Urethral caruncles occur when the outermost part of the urethra everts or turns out. When the mucosa is circumferentially everted, meaning the growth encompasses the entire diameter of the urethra, rather than just a segment of the urethra, the lesion is a urethral prolapse.  Urethral caruncles are often asymptomatic and found on pelvic exam.  Symptoms may include pain, light bleeding, painful urination, or impeded flow of urine. Women may also notice a bump at the urethral meatus.  In asymptomatic patients, treatment is often not necessary. For symptomatic patients, treatment includes vaginal estrogen cream, anti-inflammatory medications such as Motrin, and warm sitz baths.  Surgical excision is recommended for larger, symptomatic caruncles that do not respond to more conservative treatment.

## 2020-09-29 NOTE — Progress Notes (Signed)
09/29/2020 10:43 AM   [name] [name] 07/29/1955 [id number]  Referring provider: [name], MD [address] [place],  [place] [address]  Chief Complaint  Patient presents with   New Patient (Initial Visit)    Urethral Polyp    HPI: 65 year old female who presents today for further evaluation of possible urethral mass.   She reported occasional dysuria/urethral irritation after voiding to her primary care physician.  She is and has felt like she did not wipe well and for that on occasion, she did have a small amount of burning urination.  On exam, she was found to have a urethral lesion for which she presents today for further evaluation.  She denies any significant urgency, frequency, or incontinence.  No vaginal symptoms.  She is postmenopausal.   PMH: Past Medical History:  Diagnosis Date   Allergy    Anxiety    GERD (gastroesophageal reflux disease)    Hyperlipidemia    Presence of dental prosthetic device    Implant - 1 tooth, top front   Wears contact lenses     Surgical History: Past Surgical History:  Procedure Laterality Date   ESOPHAGOGASTRODUODENOSCOPY (EGD) WITH PROPOFOL N/A 11/25/2017   Procedure: ESOPHAGOGASTRODUODENOSCOPY (EGD) WITH PROPOFOL;  Surgeon: Manya Silvas, MD;  Location: [facility] ENDOSCOPY;  Service: Endoscopy;  Laterality: N/A;   ESOPHAGOGASTRODUODENOSCOPY (EGD) WITH PROPOFOL N/A 10/30/2019   Procedure: ESOPHAGOGASTRODUODENOSCOPY (EGD) WITH PROPOFOL;  Surgeon: Lucilla Lame, MD;  Location: [facility] ENDOSCOPY;  Service: Endoscopy;  Laterality: N/A;   MANDIBLE FRACTURE SURGERY      Home Medications:  Allergies as of 09/29/2020       Reactions   Citrus Nausea And Vomiting   Oranges   Codeine Other (See Comments)   Jittery   Singulair [montelukast Sodium] Rash   petechial        Medication List        Accurate as of September 29, 2020 10:43 AM. If you have any questions, ask your nurse or doctor.          Celebrate  Calcium Citrate 500-500 MG-UNIT chewable tablet Generic drug: calcium citrate-vitamin D Chew 1 tablet by mouth daily.   cholecalciferol 25 MCG (1000 UNIT) tablet Commonly known as: VITAMIN D3 Take 1,000 Units by mouth daily.   estradiol 0.1 MG/GM vaginal cream Commonly known as: ESTRACE Discard applicator Apply pea sized amount to tip of finger to urethra before bed. Wash hands well after application. Use Monday, Wednesday and Friday Started by: Hollice Espy, MD   ipratropium 0.03 % nasal spray Commonly known as: ATROVENT Place 2 sprays into both nostrils every 12 (twelve) hours.   pantoprazole sodium 40 mg/20 mL Pack Commonly known as: PROTONIX MIX 1 PACKET WITH APPLESAUCE AND TAKE BY MOUTH ONCE DAILY   raloxifene 60 MG tablet Commonly known as: EVISTA TAKE 1 TABLET (60 MG TOTAL) BY MOUTH DAILY.   Vitamin B-12 3000 MCG Subl Place 3,000 mcg under the tongue daily.        Allergies:  Allergies  Allergen Reactions   Citrus Nausea And Vomiting    Oranges   Codeine Other (See Comments)    Jittery   Singulair [Montelukast Sodium] Rash    petechial    Family History: Family History  Problem Relation Age of Onset   Hyperlipidemia Mother    Diabetes Mother    COPD Mother    Heart disease Mother        smoker   Cancer Mother  lung CA mets    Hyperlipidemia Father    Heart disease Father 77       ami, smoker   Early death Father    Stroke Sister    COPD Sister    Cancer Sister 57       lymphoma   Diabetes Brother    Stroke Maternal Grandmother    Breast cancer Maternal Aunt        mat great aunt   Bladder Cancer Neg Hx    Kidney cancer Neg Hx    Prostate cancer Neg Hx     Social History:  reports that she has never smoked. She has never used smokeless tobacco. She reports that she does not drink alcohol and does not use drugs.   Physical Exam: BP 123/77   Pulse 84   Ht 5\' 4"  (1.626 m)   Wt 129 lb (58.5 kg)   LMP 11/20/2012   BMI 22.14  kg/m   Constitutional:  Alert and oriented, No acute distress. HEENT: [place] AT, moist mucus membranes.  Trachea midline, no masses. Cardiovascular: No clubbing, cyanosis, or edema. Respiratory: Normal respiratory effort, no increased work of breathing. Pelvic exam chaperoned by Fonnie Jarvis, CMA.  Normal external genitalia.  5 mm urethral carbuncle, somewhat beefy and red at the 6 o'clock position of the urethral meatus with slight hypermobility but without urethral discharge.  Slightly atrophic vaginal tissues with good vaginal vault support. Skin: No rashes, bruises or suspicious lesions. Neurologic: Grossly intact, no focal deficits, moving all 4 extremities. Psychiatric: Normal mood and affect.  Laboratory Data:  Lab Results  Component Value Date   CREATININE 0.55 09/05/2020    Urinalysis UA today is normal, no microscopic blood or any other findings   Assessment & Plan:    1. Urethral caruncle Exam today consistent with benign urethral caruncle, with some mild irritation  Given that she is mildly symptomatic, would recommend a small amount of topical estrogen cream, 3 times a week until the caruncle regresses/resolves her symptoms improved.  She is agreeable this plan.  We discussed washing hands after applying and 6 precautions.  All questions answered.  - Urinalysis, Complete   Return if symptoms worsen or fail to improve.  Hollice Espy, MD  [facility] [address] [place], [place] [address] [phone]

## 2020-10-03 ENCOUNTER — Other Ambulatory Visit: Payer: Self-pay

## 2020-10-03 MED FILL — Pantoprazole Sodium For Delayed Release Susp Packet 40 MG: ORAL | 30 days supply | Qty: 30 | Fill #3 | Status: CN

## 2020-10-06 ENCOUNTER — Other Ambulatory Visit: Payer: Self-pay

## 2020-10-06 MED ORDER — OMEPRAZOLE 40 MG PO CPDR
40.0000 mg | DELAYED_RELEASE_CAPSULE | Freq: Every day | ORAL | 3 refills | Status: DC
  Filled 2020-10-06: qty 30, 30d supply, fill #0

## 2020-10-06 MED FILL — Pantoprazole Sodium For Delayed Release Susp Packet 40 MG: ORAL | 30 days supply | Qty: 30 | Fill #3 | Status: CN

## 2020-10-19 DIAGNOSIS — M810 Age-related osteoporosis without current pathological fracture: Secondary | ICD-10-CM

## 2020-10-20 ENCOUNTER — Other Ambulatory Visit: Payer: Self-pay

## 2020-10-20 DIAGNOSIS — R1310 Dysphagia, unspecified: Secondary | ICD-10-CM | POA: Diagnosis not present

## 2020-10-20 DIAGNOSIS — D3705 Neoplasm of uncertain behavior of pharynx: Secondary | ICD-10-CM | POA: Diagnosis not present

## 2020-10-20 DIAGNOSIS — K120 Recurrent oral aphthae: Secondary | ICD-10-CM | POA: Diagnosis not present

## 2020-10-20 MED ORDER — NYSTATIN 100000 UNIT/ML MT SUSP
OROMUCOSAL | 0 refills | Status: DC
  Filled 2020-10-20: qty 300, 10d supply, fill #0

## 2020-10-21 ENCOUNTER — Other Ambulatory Visit: Payer: Self-pay

## 2020-10-21 MED ORDER — AMOXICILLIN 250 MG/5ML PO SUSR
ORAL | 0 refills | Status: DC
  Filled 2020-10-21: qty 200, 10d supply, fill #0

## 2020-10-21 MED ORDER — PREDNISONE 10 MG PO TABS
ORAL_TABLET | ORAL | 0 refills | Status: DC
  Filled 2020-10-21: qty 21, 6d supply, fill #0

## 2020-10-28 ENCOUNTER — Other Ambulatory Visit: Payer: Self-pay

## 2020-10-31 DIAGNOSIS — K120 Recurrent oral aphthae: Secondary | ICD-10-CM | POA: Diagnosis not present

## 2020-10-31 DIAGNOSIS — R1310 Dysphagia, unspecified: Secondary | ICD-10-CM | POA: Diagnosis not present

## 2020-11-02 ENCOUNTER — Other Ambulatory Visit: Payer: Self-pay

## 2020-11-02 MED ORDER — OMEPRAZOLE 40 MG PO CPDR
40.0000 mg | DELAYED_RELEASE_CAPSULE | Freq: Every day | ORAL | 3 refills | Status: DC
  Filled 2020-11-02: qty 30, 30d supply, fill #0
  Filled 2020-12-02: qty 30, 30d supply, fill #1
  Filled 2020-12-29: qty 60, 60d supply, fill #2

## 2020-11-08 ENCOUNTER — Ambulatory Visit
Admission: RE | Admit: 2020-11-08 | Discharge: 2020-11-08 | Disposition: A | Discharge status: Home / Self Care | Payer: 59 | Source: Ambulatory Visit | Attending: Internal Medicine | Admitting: Internal Medicine

## 2020-11-08 ENCOUNTER — Other Ambulatory Visit: Payer: Self-pay

## 2020-11-08 DIAGNOSIS — M8589 Other specified disorders of bone density and structure, multiple sites: Secondary | ICD-10-CM | POA: Diagnosis not present

## 2020-11-08 DIAGNOSIS — Z780 Asymptomatic menopausal state: Secondary | ICD-10-CM | POA: Diagnosis not present

## 2020-11-08 DIAGNOSIS — M810 Age-related osteoporosis without current pathological fracture: Secondary | ICD-10-CM | POA: Diagnosis not present

## 2020-12-02 ENCOUNTER — Other Ambulatory Visit: Payer: Self-pay

## 2020-12-12 ENCOUNTER — Other Ambulatory Visit: Payer: Self-pay

## 2020-12-12 DIAGNOSIS — K120 Recurrent oral aphthae: Secondary | ICD-10-CM | POA: Diagnosis not present

## 2020-12-12 MED ORDER — NYSTATIN 100000 UNIT/ML MT SUSP
Freq: Two times a day (BID) | OROMUCOSAL | 12 refills | Status: DC
  Filled 2020-12-12: qty 300, 10d supply, fill #0
  Filled 2021-06-13: qty 300, 10d supply, fill #1
  Filled 2021-09-11: qty 300, 10d supply, fill #2

## 2020-12-23 ENCOUNTER — Other Ambulatory Visit: Payer: Self-pay

## 2020-12-29 ENCOUNTER — Other Ambulatory Visit: Payer: Self-pay

## 2021-02-17 ENCOUNTER — Other Ambulatory Visit: Payer: Self-pay

## 2021-02-17 ENCOUNTER — Other Ambulatory Visit: Payer: Self-pay | Admitting: Gastroenterology

## 2021-02-20 ENCOUNTER — Other Ambulatory Visit: Payer: Self-pay

## 2021-02-21 ENCOUNTER — Other Ambulatory Visit: Payer: Self-pay

## 2021-02-22 ENCOUNTER — Other Ambulatory Visit: Payer: Self-pay

## 2021-02-22 ENCOUNTER — Other Ambulatory Visit: Payer: Self-pay | Admitting: Internal Medicine

## 2021-02-22 DIAGNOSIS — Z1231 Encounter for screening mammogram for malignant neoplasm of breast: Secondary | ICD-10-CM

## 2021-02-22 MED FILL — Omeprazole Cap Delayed Release 40 MG: ORAL | 90 days supply | Qty: 90 | Fill #0 | Status: AC

## 2021-03-01 ENCOUNTER — Ambulatory Visit
Admission: RE | Admit: 2021-03-01 | Discharge: 2021-03-01 | Disposition: A | Discharge status: Home / Self Care | Payer: 59 | Source: Ambulatory Visit | Attending: Internal Medicine | Admitting: Internal Medicine

## 2021-03-01 ENCOUNTER — Other Ambulatory Visit: Payer: Self-pay

## 2021-03-01 DIAGNOSIS — Z1231 Encounter for screening mammogram for malignant neoplasm of breast: Secondary | ICD-10-CM | POA: Diagnosis not present

## 2021-03-15 ENCOUNTER — Encounter: Payer: 59 | Admitting: Internal Medicine

## 2021-03-15 ENCOUNTER — Other Ambulatory Visit: Payer: Self-pay

## 2021-03-16 ENCOUNTER — Telehealth: Payer: Self-pay | Admitting: Internal Medicine

## 2021-03-16 NOTE — Telephone Encounter (Signed)
Patient has a lab appt, there are no orders in.

## 2021-03-17 ENCOUNTER — Telehealth: Payer: Self-pay

## 2021-03-17 ENCOUNTER — Other Ambulatory Visit (INDEPENDENT_AMBULATORY_CARE_PROVIDER_SITE_OTHER): Payer: 59

## 2021-03-17 ENCOUNTER — Other Ambulatory Visit: Payer: Self-pay

## 2021-03-17 DIAGNOSIS — Z79899 Other long term (current) drug therapy: Secondary | ICD-10-CM | POA: Diagnosis not present

## 2021-03-17 DIAGNOSIS — E785 Hyperlipidemia, unspecified: Secondary | ICD-10-CM

## 2021-03-17 LAB — COMPREHENSIVE METABOLIC PANEL
ALT: 11 U/L (ref 0–35)
AST: 17 U/L (ref 0–37)
Albumin: 3.5 g/dL (ref 3.5–5.2)
Alkaline Phosphatase: 53 U/L (ref 39–117)
BUN: 22 mg/dL (ref 6–23)
CO2: 32 mEq/L (ref 19–32)
Calcium: 8.5 mg/dL (ref 8.4–10.5)
Chloride: 103 mEq/L (ref 96–112)
Creatinine, Ser: 0.59 mg/dL (ref 0.40–1.20)
GFR: 95.5 mL/min (ref >60.00)
Glucose, Bld: 82 mg/dL (ref 70–99)
Potassium: 4.1 mEq/L (ref 3.5–5.1)
Sodium: 140 mEq/L (ref 135–145)
Total Bilirubin: 0.4 mg/dL (ref 0.2–1.2)
Total Protein: 6.4 g/dL (ref 6.0–8.3)

## 2021-03-17 LAB — LIPID PANEL
Cholesterol: 161 mg/dL (ref 0–200)
HDL: 71 mg/dL (ref >39.00)
LDL Cholesterol: 77 mg/dL (ref 0–99)
NonHDL: 90.1
Total CHOL/HDL Ratio: 2
Triglycerides: 68 mg/dL (ref 0.0–149.0)
VLDL: 13.6 mg/dL (ref 0.0–40.0)

## 2021-03-17 NOTE — Telephone Encounter (Signed)
Error

## 2021-03-17 NOTE — Telephone Encounter (Signed)
Labs ordered for lab appt.  

## 2021-03-22 ENCOUNTER — Other Ambulatory Visit: Payer: Self-pay

## 2021-03-22 ENCOUNTER — Encounter: Payer: Self-pay | Admitting: Internal Medicine

## 2021-03-22 ENCOUNTER — Ambulatory Visit: Payer: 59 | Admitting: Internal Medicine

## 2021-03-22 VITALS — BP 102/68 | HR 78 | Temp 98.1°F | Ht 64.0 in | Wt 133.2 lb

## 2021-03-22 DIAGNOSIS — K224 Dyskinesia of esophagus: Secondary | ICD-10-CM | POA: Diagnosis not present

## 2021-03-22 DIAGNOSIS — M810 Age-related osteoporosis without current pathological fracture: Secondary | ICD-10-CM

## 2021-03-22 DIAGNOSIS — Z8616 Personal history of COVID-19: Secondary | ICD-10-CM | POA: Diagnosis not present

## 2021-03-22 NOTE — Assessment & Plan Note (Addendum)
BMD is improving on raloxifene and calcium. She has pill dysphagia but has been able to take the raloxifene.

## 2021-03-22 NOTE — Progress Notes (Signed)
Subjective:  Patient ID: [name] [name], female    DOB: [birth year]  Age: 66 y.o. MRN: [id number]  CC: The primary encounter diagnosis was History of COVID-19. Diagnoses of Age-related osteoporosis without current pathological fracture and Esophageal dysmotility were also pertinent to this visit.  HPI [name] [name] presents for 6 month follow up on osteoporosis, GERD with esophageal dyskinesia Chief Complaint  Patient presents with   Follow-up    6 month follow up    This visit occurred during the SARS-CoV-2 public health emergency.  Safety protocols were in place, including screening questions prior to the visit, additional usage of staff PPE, and extensive cleaning of exam room while observing appropriate contact time as indicated for disinfecting solutions.   GERD with dysphagia:  s/p EGD with dilation August 2021 by Allen Norris.  Ever since the EGD and dilationshe feels there is something in her throat and she has tried to to clear it up by ing,   which makes it worse.  Taking omeprazole but empties it in applesauce .  Still feels that pills get stuck in throat, especially Evista .  Told it was anxiety.  Allen Norris has   Osteoporosis:  taking Evista .  And working calcium intake of 1600 to 1800 mg daily via diet and supplements.  viReewed BMD on last DEXA August 2022 noting improvement   Recent COVID infection: mild, uncomplicated    Outpatient Medications Prior to Visit  Medication Sig Dispense Refill   calcium citrate-vitamin D (CELEBRATE CALCIUM CITRATE) 500-500 MG-UNIT chewable tablet Chew 1 tablet by mouth daily.      cholecalciferol (VITAMIN D3) 25 MCG (1000 UNIT) tablet Take 1,000 Units by mouth daily.     Cyanocobalamin (VITAMIN B-12) 3000 MCG SUBL Place 3,000 mcg under the tongue daily.     estradiol (ESTRACE) 0.1 MG/GM vaginal cream Discard applicator Apply pea sized amount to tip of finger to urethra before bed. Wash hands well after application. Use Monday, Wednesday and Friday  42.5 g 12   ipratropium (ATROVENT) 0.03 % nasal spray Place 2 sprays into both nostrils every 12 (twelve) hours. 30 mL 0   omeprazole (PRILOSEC) 40 MG capsule Take 1 capsule (40 mg total) by mouth daily. 90 capsule 3   raloxifene (EVISTA) 60 MG tablet TAKE 1 TABLET (60 MG TOTAL) BY MOUTH DAILY. 90 tablet 5   magic mouthwash (nystatin, hydrocortisone, diphenhydrAMINE) suspension Swish, gargle and spit with 36ml three (3) times a day for 10 days. (Patient not taking: Reported on 03/22/2021) 300 mL 12   amoxicillin (AMOXIL) 250 MG/5ML suspension take 5 mls by mouth three times a day for 10 days and dispose remainder (Patient not taking: Reported on 03/22/2021) 200 mL 0   pantoprazole sodium (PROTONIX) 40 mg/20 mL PACK MIX 1 PACKET WITH APPLESAUCE AND TAKE BY MOUTH ONCE DAILY 30 mL 11   predniSONE (DELTASONE) 10 MG tablet Take 6 tabs on day 1, 5 tabs on day 2, 4 tabs on day 3, 3 tabs on day 4, 2 tabs on day 5, 1 tab on day 6. Then stop. (Patient not taking: Reported on 03/22/2021) 21 tablet 0   No facility-administered medications prior to visit.    Review of Systems;  Patient denies headache, fevers, malaise, unintentional weight loss, skin rash, eye pain, sinus congestion and sinus pain, sore throat, dysphagia,  hemoptysis , cough, dyspnea, wheezing, chest pain, palpitations, orthopnea, edema, abdominal pain, nausea, melena, diarrhea, constipation, flank pain, dysuria, hematuria, urinary  Frequency, nocturia, numbness, tingling, seizures,  Focal weakness, Loss of consciousness,  Tremor, insomnia, depression, anxiety, and suicidal ideation.      Objective:  BP 102/68 (BP Location: Left Arm, Patient Position: Sitting, Cuff Size: Normal)    Pulse 78    Temp 98.1 F (36.7 C) (Oral)    Ht 5\' 4"  (1.626 m)    Wt 133 lb 3.2 oz (60.4 kg)    LMP 11/20/2012    SpO2 99%    BMI 22.86 kg/m   BP Readings from Last 3 Encounters:  03/22/21 102/68  09/29/20 123/77  09/08/20 108/64    Wt Readings from Last 3  Encounters:  03/22/21 133 lb 3.2 oz (60.4 kg)  09/29/20 129 lb (58.5 kg)  09/08/20 126 lb 12.8 oz (57.5 kg)    General appearance: alert, cooperative and appears stated age Ears: normal TM's and external ear canals both ears Throat: lips, mucosa, and tongue normal; teeth and gums normal Neck: no adenopathy, no carotid bruit, supple, symmetrical, trachea midline and thyroid not enlarged, symmetric, no tenderness/mass/nodules Back: symmetric, no curvature. ROM normal. No CVA tenderness. Lungs: clear to auscultation bilaterally Heart: regular rate and rhythm, S1, S2 normal, no murmur, click, rub or gallop Abdomen: soft, non-tender; bowel sounds normal; no masses,  no organomegaly Pulses: 2+ and symmetric Skin: Skin color, texture, turgor normal. No rashes or lesions Lymph nodes: Cervical, supraclavicular, and axillary nodes normal.  No results found for: HGBA1C  Lab Results  Component Value Date   CREATININE 0.59 03/17/2021   CREATININE 0.55 09/05/2020   CREATININE 0.59 07/07/2019    Lab Results  Component Value Date   WBC 4.7 09/05/2020   HGB 12.4 09/05/2020   HCT 37.9 09/05/2020   PLT 197.0 09/05/2020   GLUCOSE 82 03/17/2021   CHOL 161 03/17/2021   TRIG 68.0 03/17/2021   HDL 71.00 03/17/2021   LDLDIRECT 77.0 09/24/2016   LDLCALC 77 03/17/2021   ALT 11 03/17/2021   AST 17 03/17/2021   NA 140 03/17/2021   K 4.1 03/17/2021   CL 103 03/17/2021   CREATININE 0.59 03/17/2021   BUN 22 03/17/2021   CO2 32 03/17/2021   TSH 1.82 09/05/2020    MM 3D SCREEN BREAST BILATERAL  Result Date: 03/01/2021 CLINICAL DATA:  Screening. EXAM: DIGITAL SCREENING BILATERAL MAMMOGRAM WITH TOMOSYNTHESIS AND CAD TECHNIQUE: Bilateral screening digital craniocaudal and mediolateral oblique mammograms were obtained. Bilateral screening digital breast tomosynthesis was performed. The images were evaluated with computer-aided detection. COMPARISON:  Previous exam(s). ACR Breast Density Category b:  There are scattered areas of fibroglandular density. FINDINGS: There are no findings suspicious for malignancy. IMPRESSION: No mammographic evidence of malignancy. A result letter of this screening mammogram will be mailed directly to the patient. RECOMMENDATION: Screening mammogram in one year. (Code:SM-B-01Y) BI-RADS CATEGORY  1: Negative. Electronically Signed   By: Lillia Mountain M.D.   On: 03/01/2021 14:34   Assessment & Plan:   Problem List Items Addressed This Visit     Osteoporosis    BMD is improving on raloxifene and calcium. She has pill dysphagia but has been able to take the raloxifene.       Esophageal dysmotility    She has been focusing on her swallowing and her anxiety.  She has no dysphagia with foods , only pills      History of COVID-19 - Primary    Second infection sept 2022.  Wants COVID 19 antibody level tested.       Relevant Orders   SARS-CoV-2 Semi-Quantitative Total Antibody, Spike  I have discontinued Laurence Spates. Nusz's pantoprazole sodium, amoxicillin, and predniSONE. I am also having her maintain her Celebrate Calcium Citrate, Vitamin B-12, raloxifene, cholecalciferol, ipratropium, estradiol, (magic mouthwash (nystatin, hydrocortisone, diphenhydrAMINE) suspension), and omeprazole.  No orders of the defined types were placed in this encounter.    I provided  30 minutes of  face-to-face time during this encounter reviewing patient's current problems and past surgeries, labs and imaging studies, providing counseling on the above mentioned problems , and coordination  of care .   Follow-up: Return in about 6 months (around 09/19/2021).   [name], MD

## 2021-03-22 NOTE — Assessment & Plan Note (Signed)
Second infection sept 2022.  Wants COVID 19 antibody level tested.

## 2021-03-22 NOTE — Patient Instructions (Signed)
Ok to take biotin and collagen for hair, skin and nails.    Suspend biotin 3 days prior to any blood tests for thyroid function  Vitamin E:  safe to take up to 800 IUs daily   You should get your Shingles vaccines any time now

## 2021-03-22 NOTE — Assessment & Plan Note (Signed)
She has been focusing on her swallowing and her anxiety.  She has no dysphagia with foods , only pills

## 2021-03-27 LAB — SARS-COV-2 SEMI-QUANTITATIVE TOTAL ANTIBODY, SPIKE: SARS COV2 AB, Total Spike Semi QN: >2500 U/mL — ABNORMAL HIGH (ref <0.8)

## 2021-05-18 ENCOUNTER — Other Ambulatory Visit: Payer: Self-pay

## 2021-05-18 MED FILL — Omeprazole Cap Delayed Release 40 MG: ORAL | 90 days supply | Qty: 90 | Fill #1 | Status: AC

## 2021-06-13 ENCOUNTER — Other Ambulatory Visit: Payer: Self-pay

## 2021-06-21 ENCOUNTER — Other Ambulatory Visit: Payer: Self-pay

## 2021-06-21 MED ORDER — ZOSTER VAC RECOMB ADJUVANTED 50 MCG/0.5ML IM SUSR
INTRAMUSCULAR | 1 refills | Status: DC
  Filled 2021-06-30: qty 0.5, 1d supply, fill #0
  Filled 2021-10-27: qty 0.5, 1d supply, fill #1

## 2021-06-27 ENCOUNTER — Other Ambulatory Visit: Payer: Self-pay

## 2021-06-30 ENCOUNTER — Other Ambulatory Visit: Payer: Self-pay

## 2021-08-24 ENCOUNTER — Other Ambulatory Visit: Payer: Self-pay

## 2021-08-24 MED FILL — Omeprazole Cap Delayed Release 40 MG: ORAL | 90 days supply | Qty: 90 | Fill #2 | Status: AC

## 2021-09-07 ENCOUNTER — Other Ambulatory Visit: Payer: Self-pay

## 2021-09-07 ENCOUNTER — Encounter: Payer: Self-pay | Admitting: Internal Medicine

## 2021-09-07 DIAGNOSIS — Z0000 Encounter for general adult medical examination without abnormal findings: Secondary | ICD-10-CM

## 2021-09-11 ENCOUNTER — Other Ambulatory Visit: Payer: Self-pay

## 2021-09-14 ENCOUNTER — Other Ambulatory Visit (INDEPENDENT_AMBULATORY_CARE_PROVIDER_SITE_OTHER): Payer: 59

## 2021-09-14 DIAGNOSIS — Z136 Encounter for screening for cardiovascular disorders: Secondary | ICD-10-CM | POA: Diagnosis not present

## 2021-09-14 DIAGNOSIS — Z0000 Encounter for general adult medical examination without abnormal findings: Secondary | ICD-10-CM | POA: Diagnosis not present

## 2021-09-14 LAB — CBC WITH DIFFERENTIAL/PLATELET
Basophils Absolute: 0 10*3/uL (ref 0.0–0.1)
Basophils Relative: 0.3 % (ref 0.0–3.0)
Eosinophils Absolute: 0.1 10*3/uL (ref 0.0–0.7)
Eosinophils Relative: 1.9 % (ref 0.0–5.0)
HCT: 36.3 % (ref 36.0–46.0)
Hemoglobin: 11.5 g/dL — ABNORMAL LOW (ref 12.0–15.0)
Lymphocytes Relative: 33.6 % (ref 12.0–46.0)
Lymphs Abs: 1.4 10*3/uL (ref 0.7–4.0)
MCHC: 31.7 g/dL (ref 30.0–36.0)
MCV: 86.8 fl (ref 78.0–100.0)
Monocytes Absolute: 0.6 10*3/uL (ref 0.1–1.0)
Monocytes Relative: 14.7 % — ABNORMAL HIGH (ref 3.0–12.0)
Neutro Abs: 2 10*3/uL (ref 1.4–7.7)
Neutrophils Relative %: 49.5 % (ref 43.0–77.0)
Platelets: 193 10*3/uL (ref 150.0–400.0)
RBC: 4.18 Mil/uL (ref 3.87–5.11)
RDW: 14.6 % (ref 11.5–15.5)
WBC: 4.1 10*3/uL (ref 4.0–10.5)

## 2021-09-14 LAB — COMPREHENSIVE METABOLIC PANEL
ALT: 12 U/L (ref 0–35)
AST: 16 U/L (ref 0–37)
Albumin: 3.7 g/dL (ref 3.5–5.2)
Alkaline Phosphatase: 60 U/L (ref 39–117)
BUN: 21 mg/dL (ref 6–23)
CO2: 31 mEq/L (ref 19–32)
Calcium: 8.9 mg/dL (ref 8.4–10.5)
Chloride: 101 mEq/L (ref 96–112)
Creatinine, Ser: 0.64 mg/dL (ref 0.40–1.20)
GFR: 93.32 mL/min (ref >60.00)
Glucose, Bld: 86 mg/dL (ref 70–99)
Potassium: 3.8 mEq/L (ref 3.5–5.1)
Sodium: 140 mEq/L (ref 135–145)
Total Bilirubin: 0.4 mg/dL (ref 0.2–1.2)
Total Protein: 6.8 g/dL (ref 6.0–8.3)

## 2021-09-14 LAB — LIPID PANEL
Cholesterol: 149 mg/dL (ref 0–200)
HDL: 70.8 mg/dL (ref >39.00)
LDL Cholesterol: 65 mg/dL (ref 0–99)
NonHDL: 77.74
Total CHOL/HDL Ratio: 2
Triglycerides: 66 mg/dL (ref 0.0–149.0)
VLDL: 13.2 mg/dL (ref 0.0–40.0)

## 2021-09-14 LAB — VITAMIN D 25 HYDROXY (VIT D DEFICIENCY, FRACTURES): VITD: 65.49 ng/mL (ref 30.00–100.00)

## 2021-09-14 LAB — HEMOGLOBIN A1C: Hgb A1c MFr Bld: 5.5 % (ref 4.6–6.5)

## 2021-09-14 LAB — TSH: TSH: 2.71 u[IU]/mL (ref 0.35–5.50)

## 2021-09-17 IMAGING — MG DIGITAL SCREENING BILAT W/ TOMO W/ CAD
8 series · 9 of 24 positions shown · non-contrast
Comparison: Previous exam(s).

CLINICAL DATA: Screening.

EXAM:
DIGITAL SCREENING BILATERAL MAMMOGRAM WITH TOMO AND CAD

[R CC synth-2D]
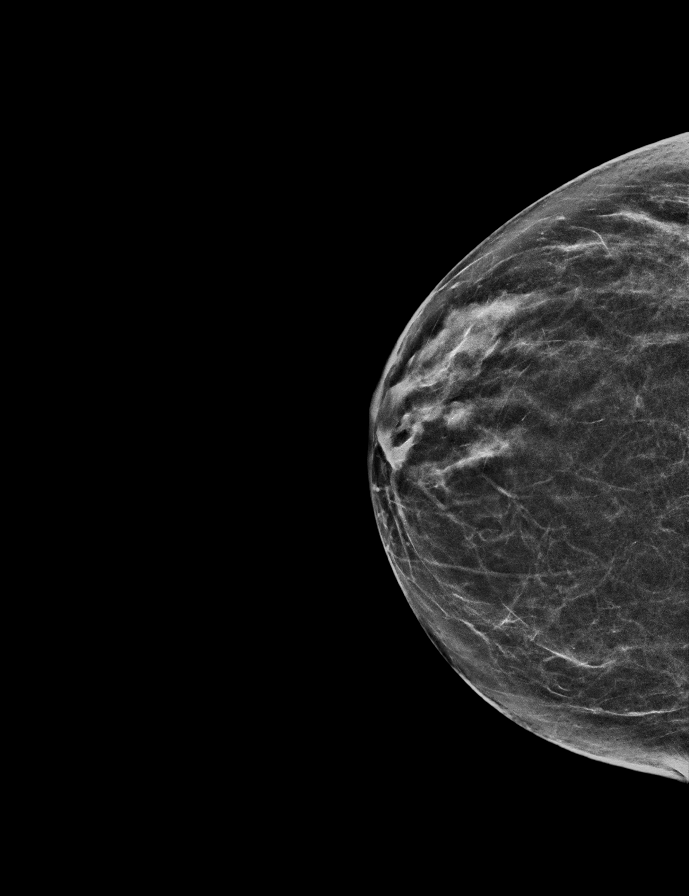

[L MLO synth-2D]
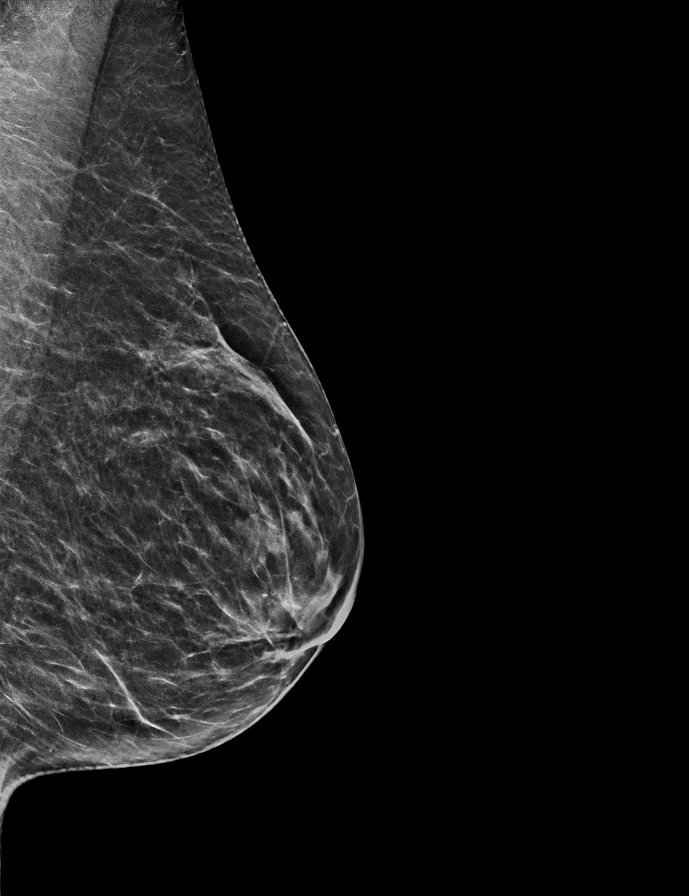

[R MLO synth-2D]
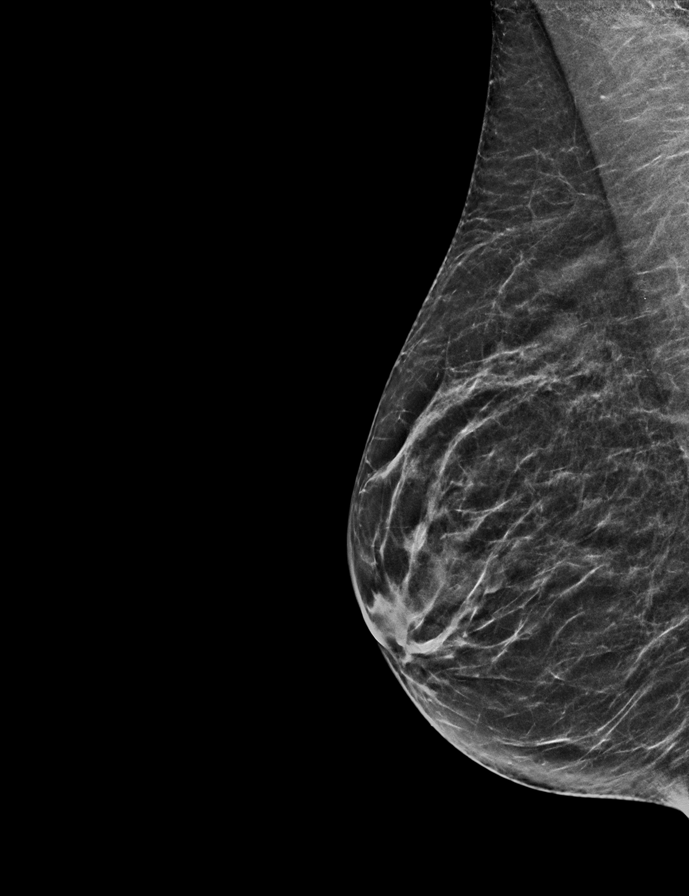

[L CC synth-2D]
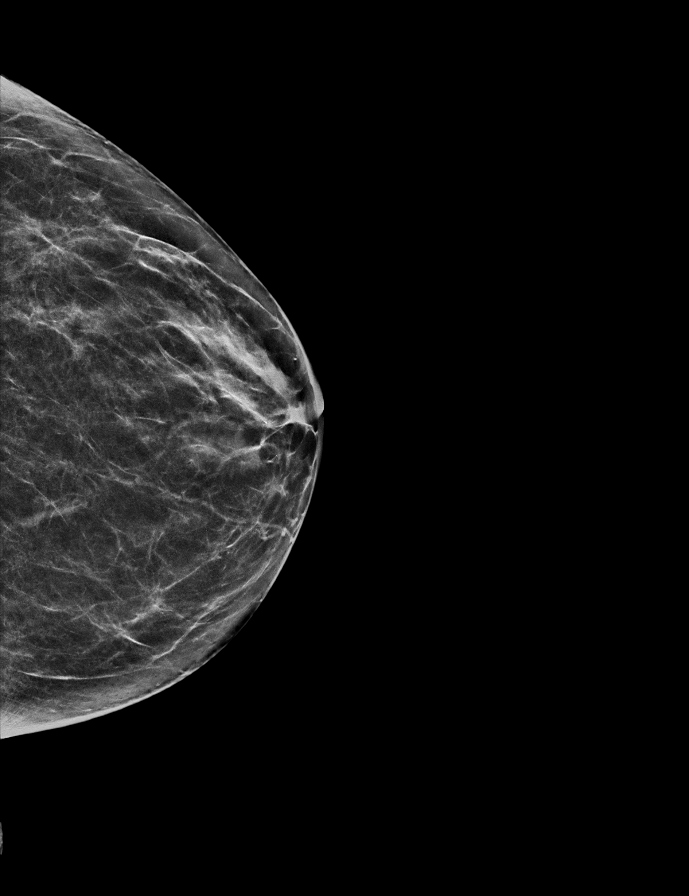

[L MLO tomo · 2 of 53 frames shown]
[frame 18/53]
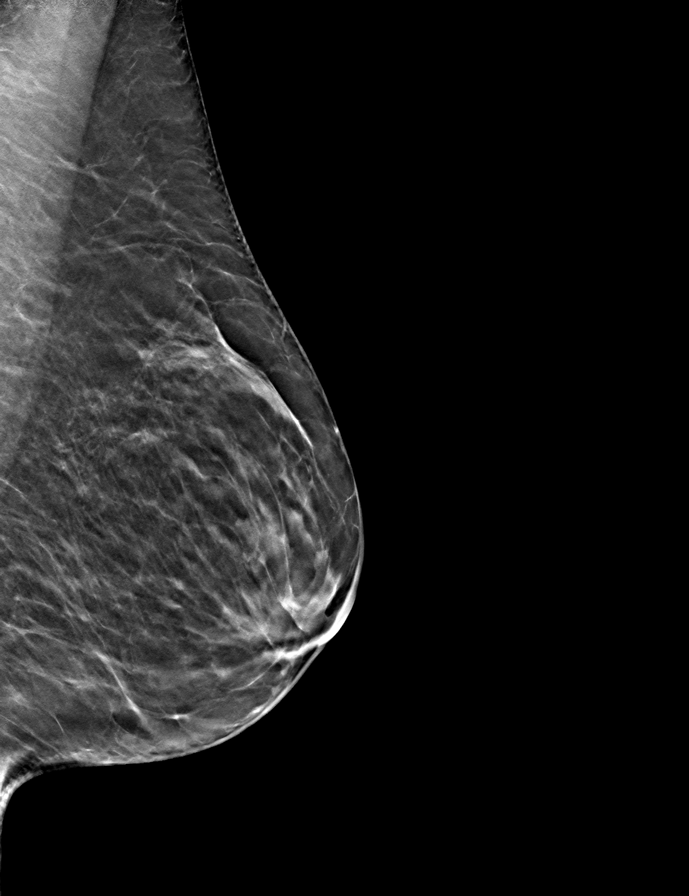
[frame 27/53]
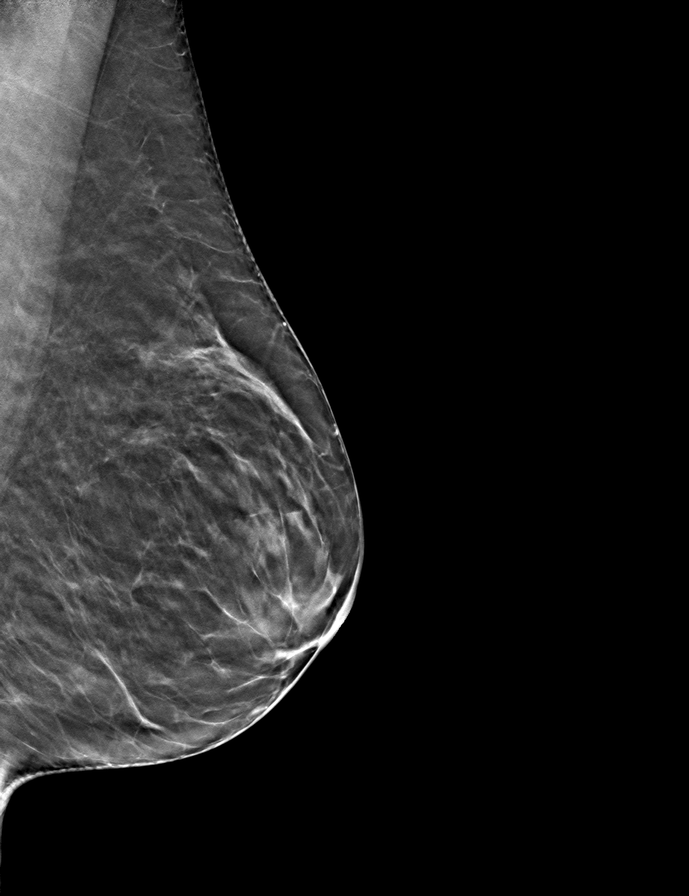

[L CC tomo · tomo slice 27/54.0]
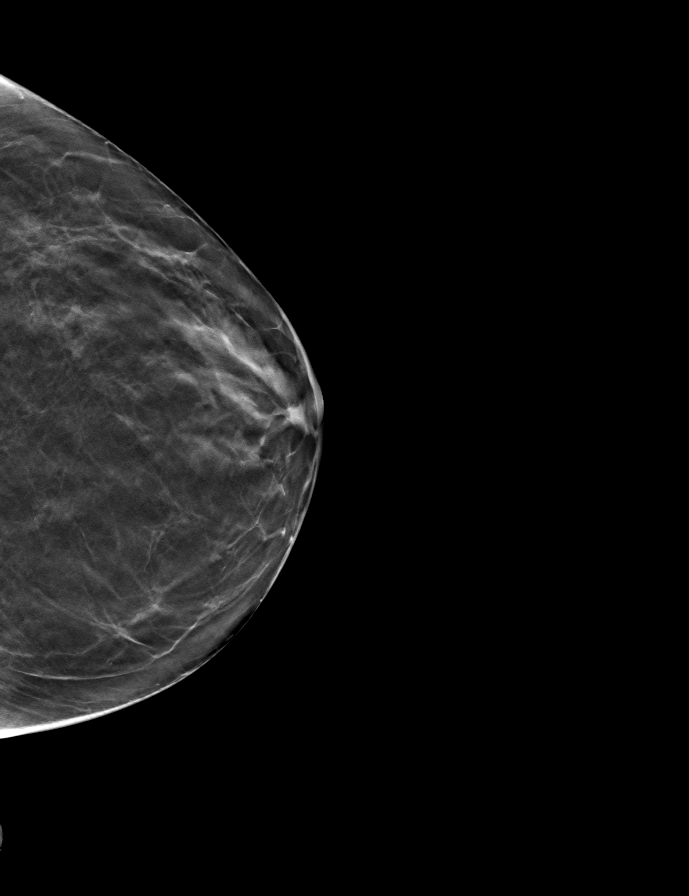

[R MLO tomo · tomo slice 26/51.0]
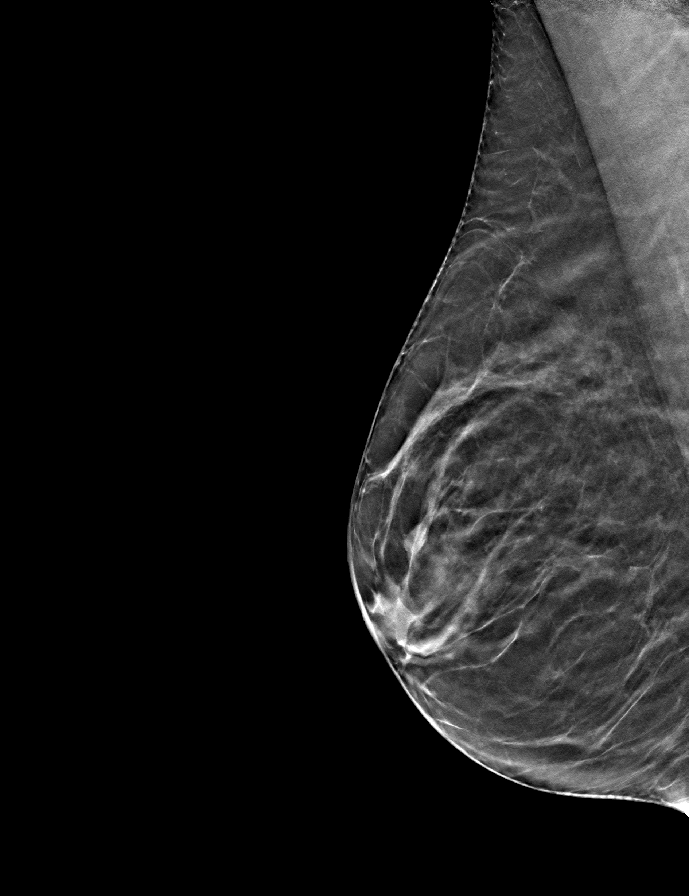

[R CC tomo · tomo slice 26/51.0]
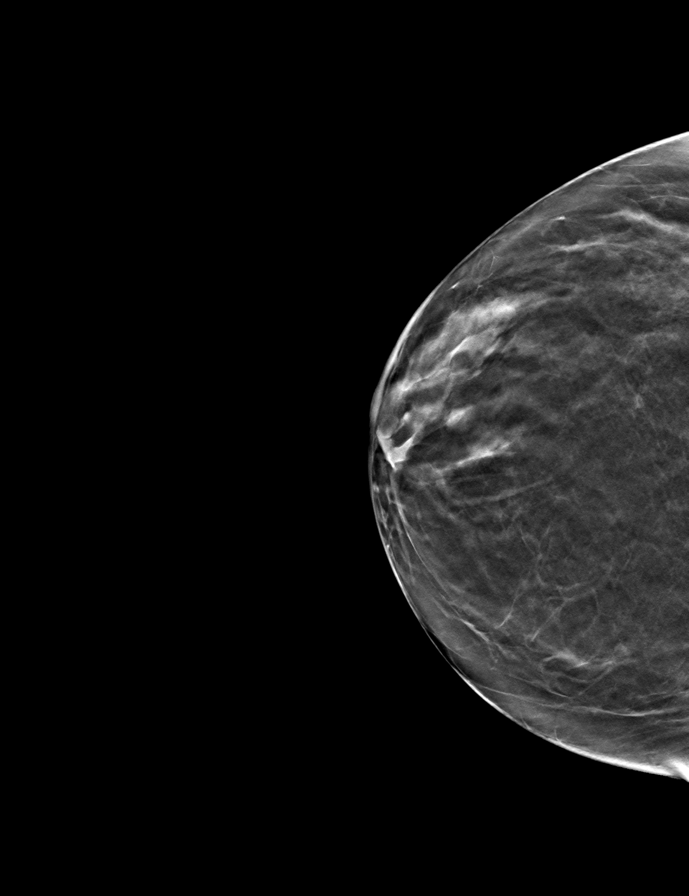

[9 of 24 positions shown; findings below may reference images not displayed]

ACR Breast Density Category b: There are scattered areas of
fibroglandular density.
FINDINGS: In the right breast, a possible asymmetry warrants further
evaluation. In the left breast, no findings suspicious for
malignancy. Images were processed with CAD.
IMPRESSION: Further evaluation is suggested for possible asymmetry in the right
breast.

RECOMMENDATION:
Diagnostic mammogram and possibly ultrasound of the right breast.
(Code:PC-U-55T)

The patient will be contacted regarding the findings, and additional
imaging will be scheduled.

BI-RADS CATEGORY  0: Incomplete. Need additional imaging evaluation
and/or prior mammograms for comparison.

## 2021-09-20 ENCOUNTER — Encounter: Payer: Self-pay | Admitting: Internal Medicine

## 2021-09-20 ENCOUNTER — Telehealth: Payer: Self-pay

## 2021-09-20 ENCOUNTER — Ambulatory Visit (INDEPENDENT_AMBULATORY_CARE_PROVIDER_SITE_OTHER): Payer: 59 | Admitting: Internal Medicine

## 2021-09-20 ENCOUNTER — Other Ambulatory Visit: Payer: Self-pay

## 2021-09-20 ENCOUNTER — Other Ambulatory Visit: Payer: Self-pay | Admitting: Internal Medicine

## 2021-09-20 VITALS — BP 124/78 | HR 83 | Temp 97.4°F | Ht 64.0 in | Wt 133.6 lb

## 2021-09-20 DIAGNOSIS — E559 Vitamin D deficiency, unspecified: Secondary | ICD-10-CM

## 2021-09-20 DIAGNOSIS — D649 Anemia, unspecified: Secondary | ICD-10-CM

## 2021-09-20 DIAGNOSIS — Z1211 Encounter for screening for malignant neoplasm of colon: Secondary | ICD-10-CM | POA: Diagnosis not present

## 2021-09-20 DIAGNOSIS — M810 Age-related osteoporosis without current pathological fracture: Secondary | ICD-10-CM

## 2021-09-20 DIAGNOSIS — R1319 Other dysphagia: Secondary | ICD-10-CM

## 2021-09-20 DIAGNOSIS — Z0000 Encounter for general adult medical examination without abnormal findings: Secondary | ICD-10-CM

## 2021-09-20 MED ORDER — RALOXIFENE HCL 60 MG PO TABS
ORAL_TABLET | Freq: Every day | ORAL | 5 refills | Status: DC
Start: 2021-09-20 — End: 2022-09-24
  Filled 2021-09-20: qty 90, 90d supply, fill #0
  Filled 2021-12-28: qty 90, 90d supply, fill #1
  Filled 2022-04-25: qty 90, 90d supply, fill #2
  Filled 2022-09-01: qty 90, 90d supply, fill #3

## 2021-09-20 NOTE — Telephone Encounter (Signed)
Patient was just in this morning for an appointment and states that she noticed on her after visit summary that her height is listed as 5' 5'', but she is 5' 4''.

## 2021-09-20 NOTE — Patient Instructions (Addendum)
Our 2nd and final shingrix injection is due by October  We will repeat your DEXA in August 2024  For your balance:  Practice standing on one leg for 10 seconds.   practice bending over  while standing on one leg (near something to hang onto)   Trial of Atrovent nasal spray for 1-2 weeks to see if it helps the throat  Referral to Dr Haig Prophet at [facility]  Return in one month for recheck of hemoglobin

## 2021-09-20 NOTE — Assessment & Plan Note (Signed)
Taking a total of 1750 Ius daily .

## 2021-09-20 NOTE — Telephone Encounter (Signed)
I have corrected in chart.

## 2021-09-20 NOTE — Assessment & Plan Note (Signed)
REQUESTING SECOND OPINION FROM [facility].  HAS HAD ENT EXAM AS WELL  . Taking prilosec

## 2021-09-20 NOTE — Progress Notes (Unsigned)
referral

## 2021-09-20 NOTE — Progress Notes (Unsigned)
The patient is here for annual preventive examination and management of other chronic and acute problems.   The risk factors are reflected in the social history.   The roster of all physicians providing medical care to patient - is listed in the Snapshot section of the chart.   Activities of daily living:  The patient is 100% independent in all ADLs: dressing, toileting, feeding as well as independent mobility   Home safety : The patient has smoke detectors in the home. They wear seatbelts.  There are no unsecured firearms at home. There is no violence in the home.    There is no risks for hepatitis, STDs or HIV. There is no   history of blood transfusion. They have no travel history to infectious disease endemic areas of the world.   The patient has seen their dentist in the last six month. They have seen their eye doctor in the last year. The patinet  denies slight hearing difficulty with regard to whispered voices and some television programs.  They have deferred audiologic testing in the last year.  They do not  have excessive sun exposure. Discussed the need for sun protection: hats, long sleeves and use of sunscreen if there is significant sun exposure.    Diet: the importance of a healthy diet is discussed. They do have a healthy diet.   The benefits of regular aerobic exercise were discussed. The patient  exercises  3 to 5 days per week  for  60 minutes.    Depression screen: there are no signs or vegative symptoms of depression- irritability, change in appetite, anhedonia, sadness/tearfullness.   The following portions of the patient's history were reviewed and updated as appropriate: allergies, current medications, past family history, past medical history,  past surgical history, past social history  and problem list.   Visual acuity was not assessed per patient preference since the patient has regular follow up with an  ophthalmologist. Hearing and body mass index were assessed and  reviewed.    During the course of the visit the patient was educated and counseled about appropriate screening and preventive services including : fall prevention , diabetes screening, nutrition counseling, colorectal cancer screening, and recommended immunizations.    Chief Complaint:   none   Review of Symptoms  Patient denies headache, fevers, malaise, unintentional weight loss, skin rash, eye pain, sinus congestion and sinus pain, sore throat, dysphagia,  hemoptysis , cough, dyspnea, wheezing, chest pain, palpitations, orthopnea, edema, abdominal pain, nausea, melena, diarrhea, constipation, flank pain, dysuria, hematuria, urinary  Frequency, nocturia, numbness, tingling, seizures,  Focal weakness, Loss of consciousness,  Tremor, insomnia, depression, anxiety, and suicidal ideation.    Physical Exam:  BP 124/78 (BP Location: Left Arm, Patient Position: Sitting, Cuff Size: Normal)   Pulse 83   Temp (!) 97.4 F (36.3 C) (Oral)   Ht '5\' 4"'$  (1.626 m)   Wt 133 lb 9.6 oz (60.6 kg)   LMP 11/20/2012   SpO2 98%   BMI 22.93 kg/m    General appearance: alert, cooperative and appears stated age Ears: normal TM's and external ear canals both ears Throat: lips, mucosa, and tongue normal; teeth and gums normal Neck: no adenopathy, no carotid bruit, supple, symmetrical, trachea midline and thyroid not enlarged, symmetric, no tenderness/mass/nodules Back: symmetric, no curvature. ROM normal. No CVA tenderness. Lungs: clear to auscultation bilaterally Heart: regular rate and rhythm, S1, S2 normal, no murmur, click, rub or gallop Abdomen: soft, non-tender; bowel sounds normal; no masses,  no organomegaly Pulses: 2+ and symmetric Skin: Skin color, texture, turgor normal. No rashes or lesions Lymph nodes: Cervical, supraclavicular, and axillary nodes normal.     Assessment and Plan:  Dysphagia REQUESTING SECOND OPINION FROM [facility].  HAS HAD ENT EXAM AS WELL  . Taking prilosec  Vitamin  D deficiency Taking a total of 1750 Ius daily .  Encounter for preventive health examination age appropriate education and counseling updated, referrals for preventative services and immunizations addressed, dietary and smoking counseling addressed, most recent labs reviewed.  I have personally reviewed and have noted:   1) the patient's medical and social history 2) The pt's use of alcohol, tobacco, and illicit drugs 3) The patient's current medications and supplements 4) Functional ability including ADL's, fall risk, home safety risk, hearing and visual impairment 5) Diet and physical activities 6) Evidence for depression or mood disorder 7) The patient's height, weight, and BMI have been recorded in the chart   I have made referrals, and provided counseling and education based on review of the above  Anemia, unspecified etiolgy unclear. Workup in progress  Lab Results  Component Value Date   WBC 4.1 09/14/2021   HGB 11.5 (L) 09/14/2021   HCT 36.3 09/14/2021   MCV 86.8 09/14/2021   PLT 193.0 09/14/2021      Updated Medication List Outpatient Encounter Medications as of 09/20/2021  Medication Sig   calcium citrate-vitamin D (CELEBRATE CALCIUM CITRATE) 500-500 MG-UNIT chewable tablet Chew 1 tablet by mouth daily.    cholecalciferol (VITAMIN D3) 25 MCG (1000 UNIT) tablet Take 1,000 Units by mouth daily.   Cyanocobalamin (VITAMIN B-12) 3000 MCG SUBL Place 3,000 mcg under the tongue daily.   estradiol (ESTRACE) 0.1 MG/GM vaginal cream Discard applicator Apply pea sized amount to tip of finger to urethra before bed. Wash hands well after application. Use Monday, Wednesday and Friday   omeprazole (PRILOSEC) 40 MG capsule Take 1 capsule (40 mg total) by mouth daily.   Zoster Vaccine Adjuvanted [facility]) injection Inject into the muscle once   magic mouthwash (nystatin, hydrocortisone, diphenhydrAMINE) suspension Swish, gargle and spit with 44m three (3) times a day for 10 days.  (Patient not taking: Reported on 03/22/2021)   [DISCONTINUED] ipratropium (ATROVENT) 0.03 % nasal spray Place 2 sprays into both nostrils every 12 (twelve) hours. (Patient not taking: Reported on 09/20/2021)   No facility-administered encounter medications on file as of 09/20/2021.

## 2021-09-21 ENCOUNTER — Other Ambulatory Visit: Payer: Self-pay

## 2021-09-21 ENCOUNTER — Other Ambulatory Visit: Payer: Self-pay | Admitting: Internal Medicine

## 2021-09-21 DIAGNOSIS — D649 Anemia, unspecified: Secondary | ICD-10-CM | POA: Insufficient documentation

## 2021-09-21 MED ORDER — IPRATROPIUM BROMIDE 0.03 % NA SOLN
2.0000 | Freq: Two times a day (BID) | NASAL | 0 refills | Status: DC
  Filled 2021-09-21: qty 30, 30d supply, fill #0

## 2021-09-21 NOTE — Assessment & Plan Note (Signed)

## 2021-09-21 NOTE — Assessment & Plan Note (Signed)
etiolgy unclear. Workup in progress  Lab Results  Component Value Date   WBC 4.1 09/14/2021   HGB 11.5 (L) 09/14/2021   HCT 36.3 09/14/2021   MCV 86.8 09/14/2021   PLT 193.0 09/14/2021

## 2021-09-22 ENCOUNTER — Other Ambulatory Visit: Payer: Self-pay

## 2021-09-25 ENCOUNTER — Encounter: Payer: Self-pay | Admitting: Internal Medicine

## 2021-10-10 ENCOUNTER — Other Ambulatory Visit: Payer: Self-pay

## 2021-10-18 ENCOUNTER — Other Ambulatory Visit (INDEPENDENT_AMBULATORY_CARE_PROVIDER_SITE_OTHER): Payer: 59

## 2021-10-18 DIAGNOSIS — D649 Anemia, unspecified: Secondary | ICD-10-CM | POA: Diagnosis not present

## 2021-10-18 LAB — CBC WITH DIFFERENTIAL/PLATELET
Basophils Absolute: 0 10*3/uL (ref 0.0–0.1)
Basophils Relative: 0.5 % (ref 0.0–3.0)
Eosinophils Absolute: 0.1 10*3/uL (ref 0.0–0.7)
Eosinophils Relative: 1.3 % (ref 0.0–5.0)
HCT: 34.8 % — ABNORMAL LOW (ref 36.0–46.0)
Hemoglobin: 11.3 g/dL — ABNORMAL LOW (ref 12.0–15.0)
Lymphocytes Relative: 28.9 % (ref 12.0–46.0)
Lymphs Abs: 1.4 10*3/uL (ref 0.7–4.0)
MCHC: 32.4 g/dL (ref 30.0–36.0)
MCV: 84.6 fl (ref 78.0–100.0)
Monocytes Absolute: 0.6 10*3/uL (ref 0.1–1.0)
Monocytes Relative: 12.3 % — ABNORMAL HIGH (ref 3.0–12.0)
Neutro Abs: 2.8 10*3/uL (ref 1.4–7.7)
Neutrophils Relative %: 57 % (ref 43.0–77.0)
Platelets: 221 10*3/uL (ref 150.0–400.0)
RBC: 4.12 Mil/uL (ref 3.87–5.11)
RDW: 14 % (ref 11.5–15.5)
WBC: 4.9 10*3/uL (ref 4.0–10.5)

## 2021-10-18 LAB — B12 AND FOLATE PANEL
Folate: 9.8 ng/mL (ref >5.9)
Vitamin B-12: >1500 pg/mL — ABNORMAL HIGH (ref 211–911)

## 2021-10-19 LAB — IRON,TIBC AND FERRITIN PANEL
%SAT: 13 % (calc) — ABNORMAL LOW (ref 16–45)
Ferritin: 5 ng/mL — ABNORMAL LOW (ref 16–288)
Iron: 56 ug/dL (ref 45–160)
TIBC: 448 mcg/dL (calc) (ref 250–450)

## 2021-10-24 ENCOUNTER — Encounter: Payer: Self-pay | Admitting: Internal Medicine

## 2021-10-27 ENCOUNTER — Other Ambulatory Visit: Payer: Self-pay

## 2021-11-10 ENCOUNTER — Other Ambulatory Visit: Payer: Self-pay

## 2021-11-16 ENCOUNTER — Other Ambulatory Visit (HOSPITAL_COMMUNITY): Payer: Self-pay

## 2021-11-17 ENCOUNTER — Other Ambulatory Visit: Payer: Self-pay

## 2021-11-17 MED FILL — Omeprazole Cap Delayed Release 40 MG: ORAL | 90 days supply | Qty: 90 | Fill #3 | Status: AC

## 2021-12-28 ENCOUNTER — Other Ambulatory Visit: Payer: Self-pay

## 2021-12-28 DIAGNOSIS — Z1211 Encounter for screening for malignant neoplasm of colon: Secondary | ICD-10-CM | POA: Diagnosis not present

## 2021-12-28 DIAGNOSIS — R1310 Dysphagia, unspecified: Secondary | ICD-10-CM | POA: Diagnosis not present

## 2021-12-28 MED ORDER — NA SULFATE-K SULFATE-MG SULF 17.5-3.13-1.6 GM/177ML PO SOLN
ORAL | 0 refills | Status: DC
  Filled 2021-12-28: qty 354, 1d supply, fill #0

## 2022-01-01 ENCOUNTER — Other Ambulatory Visit: Payer: Self-pay

## 2022-01-01 MED ORDER — NYSTATIN 100000 UNIT/ML MT SUSP
OROMUCOSAL | 12 refills | Status: DC
  Filled 2022-01-01: qty 300, 10d supply, fill #0
  Filled 2022-04-25: qty 300, 10d supply, fill #1
  Filled 2022-08-23: qty 300, 10d supply, fill #2

## 2022-02-21 ENCOUNTER — Telehealth: Payer: Self-pay | Admitting: Family

## 2022-02-21 ENCOUNTER — Ambulatory Visit: Payer: 59 | Admitting: Family

## 2022-02-21 ENCOUNTER — Encounter: Payer: Self-pay | Admitting: Family

## 2022-02-21 ENCOUNTER — Other Ambulatory Visit: Payer: Self-pay

## 2022-02-21 VITALS — BP 112/78 | HR 77 | Temp 98.1°F | Wt 134.0 lb

## 2022-02-21 DIAGNOSIS — R051 Acute cough: Secondary | ICD-10-CM

## 2022-02-21 DIAGNOSIS — J029 Acute pharyngitis, unspecified: Secondary | ICD-10-CM | POA: Diagnosis not present

## 2022-02-21 DIAGNOSIS — K2100 Gastro-esophageal reflux disease with esophagitis, without bleeding: Secondary | ICD-10-CM

## 2022-02-21 LAB — POC COVID19 BINAXNOW: SARS Coronavirus 2 Ag: NEGATIVE

## 2022-02-21 LAB — POCT RAPID STREP A (OFFICE): Rapid Strep A Screen: NEGATIVE

## 2022-02-21 LAB — POCT INFLUENZA A/B
Influenza A, POC: NEGATIVE
Influenza B, POC: NEGATIVE

## 2022-02-21 MED ORDER — OMEPRAZOLE 40 MG PO CPDR
40.0000 mg | DELAYED_RELEASE_CAPSULE | Freq: Every day | ORAL | 0 refills | Status: DC
  Filled 2022-02-21: qty 90, 90d supply, fill #0

## 2022-02-21 NOTE — Patient Instructions (Addendum)
Please increase Allegra total 180 mg total daily.  I suspect sore throat is related to viral or allergic etiology.  Most certainly if sore throat persists or you develop new symptoms, please let me know right away is at that point I would prescribe liquid amoxicillin.    If symptoms were to persist as you head into colonoscopy for endoscopy next week, please let Dr. Haig Prophet know as he may advised to reschedule procedure

## 2022-02-21 NOTE — Telephone Encounter (Signed)
Call pt   On AVS I typed to increase allegra to '160mg'$  /day . She can actually increase to '180mg'$  QD which is the adult maximum dose.   I wanted to clarify this typo

## 2022-02-21 NOTE — Progress Notes (Signed)
 Subjective:    Patient ID: [name] [name], female    DOB: 07/30/1955, 66 y.o.   MRN: [id number]  CC: [name] [name] is a 66 y.o. female who presents today for an acute visit.    HPI: Complains of middle sore throat, improved today.  Symptoms started a week ago.  She always feels 'she has something in her throat' and she hacks 'alot' to clear her throat. She has chronic 'raspy voice'.  Endorses occassional cough. Right ear fullness, no drainage.  No fever, cp, sob, wheezing She is not taking atrovent.  She is taking children's allegra dose 5 mL.   She has colonoscopy and egd next week, Dr Locklear.  She has dust allergies and she is currently remodeling kitchen   H/o gerd, dysphagia.  She request a refill of Prilosec today Nonsmoker No h/o ckd , GFR 93 HISTORY:  Past Medical History:  Diagnosis Date   Allergy    Anxiety    GERD (gastroesophageal reflux disease)    Hyperlipidemia    Presence of dental prosthetic device    Implant - 1 tooth, top front   Wears contact lenses    Past Surgical History:  Procedure Laterality Date   ESOPHAGOGASTRODUODENOSCOPY (EGD) WITH PROPOFOL N/A 11/25/2017   Procedure: ESOPHAGOGASTRODUODENOSCOPY (EGD) WITH PROPOFOL;  Surgeon: Elliott, Robert T, MD;  Location: [facility] ENDOSCOPY;  Service: Endoscopy;  Laterality: N/A;   ESOPHAGOGASTRODUODENOSCOPY (EGD) WITH PROPOFOL N/A 10/30/2019   Procedure: ESOPHAGOGASTRODUODENOSCOPY (EGD) WITH PROPOFOL;  Surgeon: Wohl, Darren, MD;  Location: [facility] ENDOSCOPY;  Service: Endoscopy;  Laterality: N/A;   MANDIBLE FRACTURE SURGERY     Family History  Problem Relation Age of Onset   Hyperlipidemia Mother    Diabetes Mother    COPD Mother    Heart disease Mother        smoker   Cancer Mother        lung CA mets    Hyperlipidemia Father    Heart disease Father 47       ami, smoker   Early death Father    Stroke Sister    COPD Sister    Cancer Sister 58       lymphoma   Diabetes Brother    Stroke Maternal  Grandmother    Breast cancer Maternal Aunt        mat great aunt   Bladder Cancer Neg Hx    Kidney cancer Neg Hx    Prostate cancer Neg Hx     Allergies: Citrus, Codeine, and Singulair [montelukast sodium] Current Outpatient Medications on File Prior to Visit  Medication Sig Dispense Refill   calcium citrate-vitamin D (CELEBRATE CALCIUM CITRATE) 500-500 MG-UNIT chewable tablet Chew 1 tablet by mouth daily.      cholecalciferol (VITAMIN D3) 25 MCG (1000 UNIT) tablet Take 1,000 Units by mouth daily.     Cyanocobalamin (VITAMIN B-12) 3000 MCG SUBL Place 3,000 mcg under the tongue daily.     estradiol (ESTRACE) 0.1 MG/GM vaginal cream Discard applicator Apply pea sized amount to tip of finger to urethra before bed. Wash hands well after application. Use Monday, Wednesday and Friday 42.5 g 12   ipratropium (ATROVENT) 0.03 % nasal spray Place 2 sprays into both nostrils every 12 (twelve) hours. 30 mL 0   magic mouthwash (nystatin, hydrocortisone, diphenhydrAMINE) suspension Swish, gargle and spit with 10ml three (3) times a day for 10 days 300 mL 12   Na Sulfate-K Sulfate-Mg Sulf 17.5-3.13-1.6 GM/177ML SOLN Take 1 Bottle by mouth as   directed One kit contains 2 bottles.  Take both bottles at the times instructed by your provider. 354 mL 0   raloxifene (EVISTA) 60 MG tablet TAKE 1 TABLET (60 MG TOTAL) BY MOUTH DAILY. 90 tablet 5   Zoster Vaccine Adjuvanted (SHINGRIX) injection Inject into the muscle once 0.5 mL 1   No current facility-administered medications on file prior to visit.    Social History   Tobacco Use   Smoking status: Never   Smokeless tobacco: Never  Vaping Use   Vaping Use: Never used  Substance Use Topics   Alcohol use: No   Drug use: Never    Review of Systems  Constitutional:  Negative for chills and fever.  HENT:  Positive for ear pain, sore throat and voice change. Negative for sinus pressure.   Respiratory:  Positive for cough. Negative for shortness of breath  and wheezing.   Cardiovascular:  Negative for chest pain and palpitations.  Gastrointestinal:  Negative for nausea and vomiting.      Objective:    BP 112/78   Pulse 77   Temp 98.1 F (36.7 C) (Oral)   Wt 134 lb (60.8 kg)   LMP 11/20/2012   SpO2 99%   BMI 23.00 kg/m    Physical Exam Vitals reviewed.  Constitutional:      Appearance: She is well-developed.  HENT:     Head: Normocephalic and atraumatic.     Right Ear: Hearing, tympanic membrane, ear canal and external ear normal. No decreased hearing noted. No drainage, swelling or tenderness. No middle ear effusion. No foreign body. Tympanic membrane is not erythematous or bulging.     Left Ear: Hearing, tympanic membrane, ear canal and external ear normal. No decreased hearing noted. No drainage, swelling or tenderness.  No middle ear effusion. No foreign body. Tympanic membrane is not erythematous or bulging.     Nose: Nose normal. No rhinorrhea.     Right Sinus: No maxillary sinus tenderness or frontal sinus tenderness.     Left Sinus: No maxillary sinus tenderness or frontal sinus tenderness.     Mouth/Throat:     Pharynx: Uvula midline. No oropharyngeal exudate or posterior oropharyngeal erythema.     Tonsils: No tonsillar abscesses.  Eyes:     Conjunctiva/sclera: Conjunctivae normal.  Cardiovascular:     Rate and Rhythm: Regular rhythm.     Pulses: Normal pulses.     Heart sounds: Normal heart sounds.  Pulmonary:     Effort: Pulmonary effort is normal.     Breath sounds: Normal breath sounds. No wheezing, rhonchi or rales.  Lymphadenopathy:     Head:     Right side of head: No submental, submandibular, tonsillar, preauricular, posterior auricular or occipital adenopathy.     Left side of head: No submental, submandibular, tonsillar, preauricular, posterior auricular or occipital adenopathy.     Cervical: No cervical adenopathy.  Skin:    General: Skin is warm and dry.  Neurological:     Mental Status: She is  alert.  Psychiatric:        Speech: Speech normal.        Behavior: Behavior normal.        Thought Content: Thought content normal.        Assessment & Plan:   Problem List Items Addressed This Visit       Digestive   GERD (gastroesophageal reflux disease)   Relevant Medications   omeprazole (PRILOSEC) 40 MG capsule     Other     Sore throat - Primary    Overall reassuring exam.  Question if chronic versus acute.  Negative strep, flu and COVID today.  She has been taking Allegra children's version, and I advised her to increase Allegra to a total of 180 mg daily.  If symptoms persist or worsen, at that point I would have concern for bacterial pharyngitis and would start oral amoxicillin due to patient's history of dysphagia.  Has upcoming endoscopy, colonoscopy with Dr. Alice Reichert next week.      Relevant Orders   POCT rapid strep A (Completed)   Other Visit Diagnoses     Acute cough       Relevant Orders   POC COVID-19 (Completed)   POCT Influenza A/B (Completed)         I am having Derek Jack maintain her Celebrate Calcium Citrate, Vitamin B-12, cholecalciferol, estradiol, Zoster Vaccine Adjuvanted, raloxifene, ipratropium, Na Sulfate-K Sulfate-Mg Sulf, (magic mouthwash (nystatin, hydrocortisone, diphenhydrAMINE) suspension), and omeprazole.   Meds ordered this encounter  Medications   omeprazole (PRILOSEC) 40 MG capsule    Sig: Take 1 capsule (40 mg total) by mouth daily.    Dispense:  90 capsule    Refill:  0    Order Specific Question:   Supervising Provider    Answer:   [name] [[id number]]    Return precautions given.   Risks, benefits, and alternatives of the medications and treatment plan prescribed today were discussed, and patient expressed understanding.   Education regarding symptom management and diagnosis given to patient on AVS.  Continue to follow with [name], MD for routine health maintenance.   Derek Jack and I agreed  with plan.   Mable Paris, FNP

## 2022-02-21 NOTE — Assessment & Plan Note (Signed)
Overall reassuring exam.  Question if chronic versus acute.  Negative strep, flu and COVID today.  She has been taking Allegra children's version, and I advised her to increase Allegra to a total of 180 mg daily.  If symptoms persist or worsen, at that point I would have concern for bacterial pharyngitis and would start oral amoxicillin due to patient's history of dysphagia.  Has upcoming endoscopy, colonoscopy with Dr. Alice Reichert next week.

## 2022-02-22 NOTE — Telephone Encounter (Signed)
Spoke to pt and she stated the she is going to stick to the original dosage of 60 mg  2 time daily!!!

## 2022-02-27 ENCOUNTER — Encounter: Payer: Self-pay | Admitting: *Deleted

## 2022-02-27 ENCOUNTER — Ambulatory Visit: Payer: 59 | Admitting: Registered Nurse

## 2022-02-27 ENCOUNTER — Ambulatory Visit
Admission: RE | Admit: 2022-02-27 | Discharge: 2022-02-27 | Disposition: A | Discharge status: Home / Self Care | Payer: 59 | Source: Ambulatory Visit | Attending: Gastroenterology | Admitting: Gastroenterology

## 2022-02-27 ENCOUNTER — Encounter
Admission: RE | Disposition: A | Discharge status: Home / Self Care | Payer: Self-pay | Source: Ambulatory Visit | Attending: Gastroenterology

## 2022-02-27 DIAGNOSIS — R09A2 Foreign body sensation, throat: Secondary | ICD-10-CM | POA: Diagnosis not present

## 2022-02-27 DIAGNOSIS — D123 Benign neoplasm of transverse colon: Secondary | ICD-10-CM | POA: Diagnosis not present

## 2022-02-27 DIAGNOSIS — K219 Gastro-esophageal reflux disease without esophagitis: Secondary | ICD-10-CM | POA: Insufficient documentation

## 2022-02-27 DIAGNOSIS — K640 First degree hemorrhoids: Secondary | ICD-10-CM | POA: Insufficient documentation

## 2022-02-27 DIAGNOSIS — K635 Polyp of colon: Secondary | ICD-10-CM | POA: Diagnosis not present

## 2022-02-27 DIAGNOSIS — K449 Diaphragmatic hernia without obstruction or gangrene: Secondary | ICD-10-CM | POA: Diagnosis not present

## 2022-02-27 DIAGNOSIS — D126 Benign neoplasm of colon, unspecified: Secondary | ICD-10-CM | POA: Diagnosis not present

## 2022-02-27 DIAGNOSIS — Z1211 Encounter for screening for malignant neoplasm of colon: Secondary | ICD-10-CM | POA: Diagnosis not present

## 2022-02-27 DIAGNOSIS — E785 Hyperlipidemia, unspecified: Secondary | ICD-10-CM | POA: Diagnosis not present

## 2022-02-27 DIAGNOSIS — R0989 Other specified symptoms and signs involving the circulatory and respiratory systems: Secondary | ICD-10-CM | POA: Insufficient documentation

## 2022-02-27 DIAGNOSIS — R1310 Dysphagia, unspecified: Secondary | ICD-10-CM | POA: Diagnosis not present

## 2022-02-27 DIAGNOSIS — K5730 Diverticulosis of large intestine without perforation or abscess without bleeding: Secondary | ICD-10-CM | POA: Diagnosis not present

## 2022-02-27 DIAGNOSIS — K649 Unspecified hemorrhoids: Secondary | ICD-10-CM | POA: Diagnosis not present

## 2022-02-27 HISTORY — PX: COLONOSCOPY WITH PROPOFOL: SHX5780

## 2022-02-27 HISTORY — PX: ESOPHAGOGASTRODUODENOSCOPY (EGD) WITH PROPOFOL: SHX5813

## 2022-02-27 SURGERY — COLONOSCOPY WITH PROPOFOL
Anesthesia: General

## 2022-02-27 MED ORDER — GLYCOPYRROLATE 0.2 MG/ML IJ SOLN
INTRAMUSCULAR | Status: DC | PRN
  Administered 2022-02-27: .1 mg via INTRAVENOUS

## 2022-02-27 MED ORDER — LIDOCAINE HCL (CARDIAC) PF 100 MG/5ML IV SOSY
PREFILLED_SYRINGE | INTRAVENOUS | Status: DC | PRN
  Administered 2022-02-27: 60 mg via INTRAVENOUS

## 2022-02-27 MED ORDER — SODIUM CHLORIDE 0.9 % IV SOLN
INTRAVENOUS | Status: DC
  Administered 2022-02-27: 1000 mL via INTRAVENOUS

## 2022-02-27 MED ORDER — PROPOFOL 10 MG/ML IV BOLUS
INTRAVENOUS | Status: AC
  Filled 2022-02-27: qty 20

## 2022-02-27 MED ORDER — PROPOFOL 500 MG/50ML IV EMUL
INTRAVENOUS | Status: DC | PRN
  Administered 2022-02-27: 125 ug/kg/min via INTRAVENOUS

## 2022-02-27 MED ORDER — LIDOCAINE HCL (PF) 2 % IJ SOLN
INTRAMUSCULAR | Status: AC
  Filled 2022-02-27: qty 5

## 2022-02-27 MED ORDER — PROPOFOL 10 MG/ML IV BOLUS
INTRAVENOUS | Status: DC | PRN
  Administered 2022-02-27: 80 mg via INTRAVENOUS
  Administered 2022-02-27: 20 mg via INTRAVENOUS
  Administered 2022-02-27: 30 mg via INTRAVENOUS

## 2022-02-27 MED ORDER — GLYCOPYRROLATE 0.2 MG/ML IJ SOLN
INTRAMUSCULAR | Status: AC
  Filled 2022-02-27: qty 1

## 2022-02-27 NOTE — Transfer of Care (Signed)
Immediate Anesthesia Transfer of Care Note  Patient: [name] [name]  Procedure(s) Performed: COLONOSCOPY WITH PROPOFOL ESOPHAGOGASTRODUODENOSCOPY (EGD) WITH PROPOFOL  Patient Location: PACU  Anesthesia Type:General  Level of Consciousness: drowsy  Airway & Oxygen Therapy: Patient Spontanous Breathing  Post-op Assessment: Report given to RN and Post -op Vital signs reviewed and stable  Post vital signs: Reviewed and stable  Last Vitals:  Vitals Value Taken Time  BP 108/59 02/27/22 1248  Temp 36.1 C 02/27/22 1248  Pulse 109 02/27/22 1248  Resp 17 02/27/22 1248  SpO2 100 % 02/27/22 1248    Last Pain:  Vitals:   02/27/22 1248  TempSrc: Temporal  PainSc: Asleep         Complications: No notable events documented.

## 2022-02-27 NOTE — Op Note (Signed)
[facility] Gastroenterology Patient Name: [name] [name] Procedure Date: 02/27/2022 11:42 AM MRN: [id number] Account #: [id number] Date of Birth: Feb 16, 1956 Admit Type: Outpatient Age: 66 Room: [facility] ENDO ROOM 1 Gender: Female Note Status: Finalized Instrument Name: Altamese Cabal Endoscope [id number] Procedure:             Upper GI endoscopy Indications:           Globus sensation Providers:             Andrey Farmer MD, MD Medicines:             Monitored Anesthesia Care Complications:         No immediate complications. Procedure:             Pre-Anesthesia Assessment:                        - Prior to the procedure, a History and Physical was                         performed, and patient medications and allergies were                         reviewed. The patient is competent. The risks and                         benefits of the procedure and the sedation options and                         risks were discussed with the patient. All questions                         were answered and informed consent was obtained.                         Patient identification and proposed procedure were                         verified by the physician, the nurse, the                         anesthesiologist, the anesthetist and the technician                         in the endoscopy suite. Mental Status Examination:                         alert and oriented. Airway Examination: normal                         oropharyngeal airway and neck mobility. Respiratory                         Examination: clear to auscultation. CV Examination:                         normal. Prophylactic Antibiotics: The patient does not                         require prophylactic antibiotics. Prior  Anticoagulants: The patient has taken no anticoagulant                         or antiplatelet agents. ASA Grade Assessment: II - A                         patient with mild systemic  disease. After reviewing                         the risks and benefits, the patient was deemed in                         satisfactory condition to undergo the procedure. The                         anesthesia plan was to use monitored anesthesia care                         (MAC). Immediately prior to administration of                         medications, the patient was re-assessed for adequacy                         to receive sedatives. The heart rate, respiratory                         rate, oxygen saturations, blood pressure, adequacy of                         pulmonary ventilation, and response to care were                         monitored throughout the procedure. The physical                         status of the patient was re-assessed after the                         procedure.                        After obtaining informed consent, the endoscope was                         passed under direct vision. Throughout the procedure,                         the patient's blood pressure, pulse, and oxygen                         saturations were monitored continuously. The Endoscope                         was introduced through the mouth, and advanced to the                         second part of duodenum. The upper GI endoscopy was  accomplished without difficulty. The patient tolerated                         the procedure well. Findings:      The cricopharyngeal area/upper esophageal sphincter was tight and some       what difficult to pass EGD scope.      A small hiatal hernia was present.      The exam of the esophagus was otherwise normal.      The entire examined stomach was normal.      The examined duodenum was normal. Impression:            - Small hiatal hernia.                        - Normal stomach.                        - Normal examined duodenum.                        - No specimens collected. Recommendation:        - Perform a  colonoscopy today. Procedure Code(s):     --- Professional ---                        [phone], Esophagogastroduodenoscopy, flexible,                         transoral; diagnostic, including collection of                         specimen(s) by brushing or washing, when performed                         (separate procedure) Diagnosis Code(s):     --- Professional ---                        K44.9, Diaphragmatic hernia without obstruction or                         gangrene                        F45.8, Other somatoform disorders CPT copyright 2022 American Medical Association. All rights reserved. The codes documented in this report are preliminary and upon coder review may  be revised to meet current compliance requirements. Andrey Farmer MD, MD 02/27/2022 12:51:01 PM Number of Addenda: 0 Note Initiated On: 02/27/2022 11:42 AM Estimated Blood Loss:  Estimated blood loss: none.      [facility]

## 2022-02-27 NOTE — Anesthesia Postprocedure Evaluation (Signed)
Anesthesia Post Note  Patient: [name]  Procedure(s) Performed: COLONOSCOPY WITH PROPOFOL ESOPHAGOGASTRODUODENOSCOPY (EGD) WITH PROPOFOL  Patient location during evaluation: Endoscopy Anesthesia Type: General Level of consciousness: awake and alert Pain management: pain level controlled Vital Signs Assessment: post-procedure vital signs reviewed and stable Respiratory status: spontaneous breathing, nonlabored ventilation, respiratory function stable and patient connected to nasal cannula oxygen Cardiovascular status: blood pressure returned to baseline and stable Postop Assessment: no apparent nausea or vomiting Anesthetic complications: no  No notable events documented.   Last Vitals:  Vitals:   02/27/22 1248 02/27/22 1258  BP: (!) 108/59 119/74  Pulse: (!) 109 98  Resp: 17 (!) 21  Temp: (!) 36.1 C   SpO2: 100%     Last Pain:  Vitals:   02/27/22 1258  TempSrc:   PainSc: 0-No pain                 Ilene Qua

## 2022-02-27 NOTE — Anesthesia Preprocedure Evaluation (Signed)
Anesthesia Evaluation  Patient identified by MRN, date of birth, ID band Patient awake    Reviewed: Allergy & Precautions, NPO status , Patient's Chart, lab work & pertinent test results  History of Anesthesia Complications Negative for: history of anesthetic complications  Airway Mallampati: II  TM Distance: >3 FB Neck ROM: full    Dental  (+) Partial Upper   Pulmonary neg pulmonary ROS   Pulmonary exam normal        Cardiovascular negative cardio ROS Normal cardiovascular exam     Neuro/Psych  PSYCHIATRIC DISORDERS Anxiety     negative neurological ROS     GI/Hepatic Neg liver ROS,GERD  Medicated,,Dysphagia    Endo/Other  negative endocrine ROS    Renal/GU negative Renal ROS  negative genitourinary   Musculoskeletal   Abdominal   Peds  Hematology  (+) Blood dyscrasia, anemia   Anesthesia Other Findings Past Medical History: No date: Allergy No date: Anxiety No date: GERD (gastroesophageal reflux disease) No date: Hyperlipidemia No date: Presence of dental prosthetic device     Comment:  Implant - 1 tooth, top front No date: Wears contact lenses  Past Surgical History: 11/25/2017: ESOPHAGOGASTRODUODENOSCOPY (EGD) WITH PROPOFOL; N/A     Comment:  Procedure: ESOPHAGOGASTRODUODENOSCOPY (EGD) WITH               PROPOFOL;  Surgeon: Manya Silvas, MD;  Location:               [facility] ENDOSCOPY;  Service: Endoscopy;  Laterality: N/A; 10/30/2019: ESOPHAGOGASTRODUODENOSCOPY (EGD) WITH PROPOFOL; N/A     Comment:  Procedure: ESOPHAGOGASTRODUODENOSCOPY (EGD) WITH               PROPOFOL;  Surgeon: Lucilla Lame, MD;  Location: [facility]               ENDOSCOPY;  Service: Endoscopy;  Laterality: N/A; No date: MANDIBLE FRACTURE SURGERY     Reproductive/Obstetrics negative OB ROS                             Anesthesia Physical Anesthesia Plan  ASA: 2  Anesthesia Plan: General   Post-op Pain  Management: Minimal or no pain anticipated   Induction: Intravenous  PONV Risk Score and Plan: Propofol infusion and TIVA  Airway Management Planned: Natural Airway and Nasal Cannula  Additional Equipment:   Intra-op Plan:   Post-operative Plan:   Informed Consent: I have reviewed the patients History and Physical, chart, labs and discussed the procedure including the risks, benefits and alternatives for the proposed anesthesia with the patient or authorized representative who has indicated his/her understanding and acceptance.     Dental Advisory Given  Plan Discussed with: Anesthesiologist, CRNA and Surgeon  Anesthesia Plan Comments: (Patient consented for risks of anesthesia including but not limited to:  - adverse reactions to medications - risk of airway placement if required - damage to eyes, teeth, lips or other oral mucosa - nerve damage due to positioning  - sore throat or hoarseness - Damage to heart, brain, nerves, lungs, other parts of body or loss of life  Patient voiced understanding.)       Anesthesia Quick Evaluation

## 2022-02-27 NOTE — Interval H&P Note (Signed)
History and Physical Interval Note:  02/27/2022 12:18 PM  [name] [name]  has presented today for surgery, with the diagnosis of Colon Cancer Screening Dysphagia.  The various methods of treatment have been discussed with the patient and family. After consideration of risks, benefits and other options for treatment, the patient has consented to  Procedure(s): COLONOSCOPY WITH PROPOFOL (N/A) ESOPHAGOGASTRODUODENOSCOPY (EGD) WITH PROPOFOL (N/A) as a surgical intervention.  The patient's history has been reviewed, patient examined, no change in status, stable for surgery.  I have reviewed the patient's chart and labs.  Questions were answered to the patient's satisfaction.     Lesly Rubenstein  Ok to proceed with EGD/Colonoscopy

## 2022-02-27 NOTE — Progress Notes (Signed)
This encounter was created in error - please disregard.

## 2022-02-27 NOTE — H&P (Signed)
Outpatient short stay form Pre-procedure 02/27/2022  Lesly Rubenstein, MD  Primary Physician: [name], MD  Reason for visit:  Globus sensation/Colon cancer screening  History of present illness:    66 y/o lady with history of GERD and HLD here for EGD for globus sensation and screening colonoscopy. Last colonoscopy was normal 10 years ago. No blood thinners. No family history of GI malignancies. No significant abdominal surgeries.    Current Facility-Administered Medications:    0.9 %  sodium chloride infusion, , Intravenous, Continuous, Korrina Zern, Hilton Cork, MD, Last Rate: 20 mL/hr at 02/27/22 1212, 1,000 mL at 02/27/22 1212  Medications Prior to Admission  Medication Sig Dispense Refill Last Dose   calcium citrate-vitamin D (CELEBRATE CALCIUM CITRATE) 500-500 MG-UNIT chewable tablet Chew 1 tablet by mouth daily.    Past Week   cholecalciferol (VITAMIN D3) 25 MCG (1000 UNIT) tablet Take 1,000 Units by mouth daily.   Past Week   Cyanocobalamin (VITAMIN B-12) 3000 MCG SUBL Place 3,000 mcg under the tongue daily.   Past Week   estradiol (ESTRACE) 0.1 MG/GM vaginal cream Discard applicator Apply pea sized amount to tip of finger to urethra before bed. Wash hands well after application. Use Monday, Wednesday and Friday 42.5 g 12 Past Week   omeprazole (PRILOSEC) 40 MG capsule Take 1 capsule (40 mg total) by mouth daily. 90 capsule 0 Past Week   raloxifene (EVISTA) 60 MG tablet TAKE 1 TABLET (60 MG TOTAL) BY MOUTH DAILY. 90 tablet 5 Past Month   ipratropium (ATROVENT) 0.03 % nasal spray Place 2 sprays into both nostrils every 12 (twelve) hours. (Patient not taking: Reported on 02/27/2022) 30 mL 0 Not Taking   magic mouthwash (nystatin, hydrocortisone, diphenhydrAMINE) suspension Swish, gargle and spit with 35m three (3) times a day for 10 days 300 mL 12  at prn   Na Sulfate-K Sulfate-Mg Sulf 17.5-3.13-1.6 GM/177ML SOLN Take 1 Bottle by mouth as directed One kit contains 2 bottles.   Take both bottles at the times instructed by your provider. 354 mL 0    Zoster Vaccine Adjuvanted ([facility] injection Inject into the muscle once 0.5 mL 1      Allergies  Allergen Reactions   Citrus Nausea And Vomiting    Oranges   Codeine Other (See Comments)    Jittery   Singulair [Montelukast Sodium] Rash    petechial     Past Medical History:  Diagnosis Date   Allergy    Anxiety    GERD (gastroesophageal reflux disease)    Hyperlipidemia    Presence of dental prosthetic device    Implant - 1 tooth, top front   Wears contact lenses     Review of systems:  Otherwise negative.    Physical Exam  Gen: Alert, oriented. Appears stated age.  HEENT: PERRLA. Lungs: No respiratory distress CV: RRR Abd: soft, benign, no masses Ext: No edema    Planned procedures: Proceed with EGD/colonoscopy. The patient understands the nature of the planned procedure, indications, risks, alternatives and potential complications including but not limited to bleeding, infection, perforation, damage to internal organs and possible oversedation/side effects from anesthesia. The patient agrees and gives consent to proceed.  Please refer to procedure notes for findings, recommendations and patient disposition/instructions.     CLesly Rubenstein MD [facility]

## 2022-02-27 NOTE — Op Note (Signed)
[facility] Gastroenterology Patient Name: [name] [name] Procedure Date: 02/27/2022 11:42 AM MRN: [id number] Account #: [id number] Date of Birth: [birth year] Admit Type: Outpatient Age: 66 Room: [facility] ENDO ROOM 1 Gender: Female Note Status: Finalized Instrument Name: Park Meo [id number] Procedure:             Colonoscopy Indications:           Screening for colorectal malignant neoplasm Providers:             Andrey Farmer MD, MD Medicines:             Monitored Anesthesia Care Complications:         No immediate complications. Estimated blood loss:                         Minimal. Procedure:             Pre-Anesthesia Assessment:                        - Prior to the procedure, a History and Physical was                         performed, and patient medications and allergies were                         reviewed. The patient is competent. The risks and                         benefits of the procedure and the sedation options and                         risks were discussed with the patient. All questions                         were answered and informed consent was obtained.                         Patient identification and proposed procedure were                         verified by the physician, the nurse, the                         anesthesiologist, the anesthetist and the technician                         in the endoscopy suite. Mental Status Examination:                         alert and oriented. Airway Examination: normal                         oropharyngeal airway and neck mobility. Respiratory                         Examination: clear to auscultation. CV Examination:                         normal. Prophylactic Antibiotics: The patient does not  require prophylactic antibiotics. Prior                         Anticoagulants: The patient has taken no anticoagulant                         or antiplatelet agents. ASA Grade  Assessment: II - A                         patient with mild systemic disease. After reviewing                         the risks and benefits, the patient was deemed in                         satisfactory condition to undergo the procedure. The                         anesthesia plan was to use monitored anesthesia care                         (MAC). Immediately prior to administration of                         medications, the patient was re-assessed for adequacy                         to receive sedatives. The heart rate, respiratory                         rate, oxygen saturations, blood pressure, adequacy of                         pulmonary ventilation, and response to care were                         monitored throughout the procedure. The physical                         status of the patient was re-assessed after the                         procedure.                        After obtaining informed consent, the colonoscope was                         passed under direct vision. Throughout the procedure,                         the patient's blood pressure, pulse, and oxygen                         saturations were monitored continuously. The                         Colonoscope was introduced through the anus and  advanced to the the cecum, identified by appendiceal                         orifice and ileocecal valve. The colonoscopy was                         somewhat difficult due to significant looping.                         Successful completion of the procedure was aided by                         changing the patient to a prone position and applying                         abdominal pressure. The patient tolerated the                         procedure well. The quality of the bowel preparation                         was good. The ileocecal valve, appendiceal orifice,                         and rectum were photographed. Findings:      The  perianal and digital rectal examinations were normal.      A few small-mouthed diverticula were found in the ascending colon.      A 1 mm polyp was found in the transverse colon. The polyp was sessile.       The polyp was removed with a jumbo cold forceps. Resection and retrieval       were complete. Estimated blood loss was minimal.      Internal hemorrhoids were found during retroflexion. The hemorrhoids       were Grade I (internal hemorrhoids that do not prolapse).      The exam was otherwise without abnormality on direct and retroflexion       views. Impression:            - Diverticulosis in the ascending colon.                        - One 1 mm polyp in the transverse colon, removed with                         a jumbo cold forceps. Resected and retrieved.                        - Internal hemorrhoids.                        - The examination was otherwise normal on direct and                         retroflexion views. Recommendation:        - Discharge patient to home.                        - Resume previous diet.                        -  Continue present medications.                        - Await pathology results.                        - Repeat colonoscopy for surveillance based on                         pathology results.                        - Return to referring physician as previously                         scheduled. Procedure Code(s):     --- Professional ---                        [phone], Colonoscopy, flexible; with biopsy, single or                         multiple Diagnosis Code(s):     --- Professional ---                        Z12.11, Encounter for screening for malignant neoplasm                         of colon                        K64.0, First degree hemorrhoids                        D12.3, Benign neoplasm of transverse colon (hepatic                         flexure or splenic flexure)                        K57.30, Diverticulosis of large intestine  without                         perforation or abscess without bleeding CPT copyright 2022 American Medical Association. All rights reserved. The codes documented in this report are preliminary and upon coder review may  be revised to meet current compliance requirements. Andrey Farmer MD, MD 02/27/2022 12:53:39 PM Number of Addenda: 0 Note Initiated On: 02/27/2022 11:42 AM Scope Withdrawal Time: 0 hours 7 minutes 12 seconds  Total Procedure Duration: 0 hours 15 minutes 18 seconds  Estimated Blood Loss:  Estimated blood loss was minimal.      [facility]

## 2022-02-28 ENCOUNTER — Encounter: Payer: Self-pay | Admitting: Gastroenterology

## 2022-02-28 LAB — SURGICAL PATHOLOGY

## 2022-03-07 ENCOUNTER — Other Ambulatory Visit: Payer: Self-pay | Admitting: Gastroenterology

## 2022-03-07 DIAGNOSIS — R198 Other specified symptoms and signs involving the digestive system and abdomen: Secondary | ICD-10-CM

## 2022-03-07 DIAGNOSIS — R1310 Dysphagia, unspecified: Secondary | ICD-10-CM

## 2022-03-13 ENCOUNTER — Other Ambulatory Visit: Payer: Self-pay | Admitting: Gastroenterology

## 2022-03-13 DIAGNOSIS — R1310 Dysphagia, unspecified: Secondary | ICD-10-CM

## 2022-03-15 ENCOUNTER — Ambulatory Visit
Admission: RE | Admit: 2022-03-15 | Discharge: 2022-03-15 | Disposition: A | Discharge status: Home / Self Care | Payer: 59 | Source: Ambulatory Visit | Attending: Gastroenterology | Admitting: Gastroenterology

## 2022-03-15 DIAGNOSIS — R1314 Dysphagia, pharyngoesophageal phase: Secondary | ICD-10-CM | POA: Diagnosis not present

## 2022-03-15 DIAGNOSIS — R1310 Dysphagia, unspecified: Secondary | ICD-10-CM | POA: Diagnosis not present

## 2022-03-15 NOTE — Therapy (Addendum)
[facility] [place], [place], [address] Phone: [phone]   Fax:     Modified Barium Swallow  Patient Details  Name: [name] [name] MRN: [id number] Date of Birth: 03-27-1955 No data recorded  Encounter Date: 03/15/2022   End of Session - 03/15/22 1644     Visit Number 1    Number of Visits 1    Date for SLP Re-Evaluation 03/15/22    SLP Start Time 1310    SLP Stop Time  1430    SLP Time Calculation (min) 80 min    Activity Tolerance Patient tolerated treatment well             Past Medical History:  Diagnosis Date   Allergy    Anxiety    GERD (gastroesophageal reflux disease)    Hyperlipidemia    Presence of dental prosthetic device    Implant - 1 tooth, top front   Wears contact lenses     Past Surgical History:  Procedure Laterality Date   COLONOSCOPY WITH PROPOFOL N/A 02/27/2022   Procedure: COLONOSCOPY WITH PROPOFOL;  Surgeon: Lesly Rubenstein, MD;  Location: [facility] ENDOSCOPY;  Service: Endoscopy;  Laterality: N/A;   ESOPHAGOGASTRODUODENOSCOPY (EGD) WITH PROPOFOL N/A 11/25/2017   Procedure: ESOPHAGOGASTRODUODENOSCOPY (EGD) WITH PROPOFOL;  Surgeon: Manya Silvas, MD;  Location: [facility] ENDOSCOPY;  Service: Endoscopy;  Laterality: N/A;   ESOPHAGOGASTRODUODENOSCOPY (EGD) WITH PROPOFOL N/A 10/30/2019   Procedure: ESOPHAGOGASTRODUODENOSCOPY (EGD) WITH PROPOFOL;  Surgeon: Lucilla Lame, MD;  Location: [facility] ENDOSCOPY;  Service: Endoscopy;  Laterality: N/A;   ESOPHAGOGASTRODUODENOSCOPY (EGD) WITH PROPOFOL N/A 02/27/2022   Procedure: ESOPHAGOGASTRODUODENOSCOPY (EGD) WITH PROPOFOL;  Surgeon: Lesly Rubenstein, MD;  Location: [facility] ENDOSCOPY;  Service: Endoscopy;  Laterality: N/A;   FRACTURE SURGERY     MANDIBLE FRACTURE SURGERY      There were no vitals filed for this visit.  Subjective: Patient behavior: (alertness, ability to follow instructions, etc.): pt A/O x4. She was conversive and  gave detailed information re: her swallowing issues highlighting s/s of REFLUX. She reported No dx of neurological deficits; No h/o pneumonia.  Chief complaint: dysphagia  OM Exam: missing few lower molars; increased, STICKY saliva. No unilateral lingual/labial weakness. Cough strong.  Vocal quality appeared min gravely -- suspect impact from REFLUX.    Objective:  Radiological Procedure: A videoflouroscopic evaluation of oral-preparatory, reflex initiation, and pharyngeal phases of the swallow was performed; as well as a screening of the upper esophageal phase.  POSTURE: upright VIEW: lateral COMPENSATORY STRATEGIES: f/u, Dry swallow as needed BOLUSES ADMINISTERED:  Thin Liquid: 2 via tsp; 4 via Cup  Nectar-thick Liquid: 1 via tsp; 2 via Cup  Honey-thick Liquid: NT  Puree: 1 trial  Mechanical Soft: 1 trial RESULTS OF EVALUATION: ORAL PREPARATORY PHASE: (The lips, tongue, and velum are observed for strength and coordination)       **Overall Severity Rating: MILD. Pt tended to bolus piecemeal but cleared oral cavity adequately given Time for f/u, dry swallow. Diffuse, mild+ oral residue noted post initial swallow -- suspect impact from (Baseline) Dry mouth w/ Sticky saliva as well as missing Dentition. Adequate bolus control w/ thin liquids.   SWALLOW INITIATION/REFLEX: (The reflex is normal if "triggered" by the time the bolus reached the base of the tongue)  **Overall Severity Rating: MILD+. Delayed pharyngeal swallow initiation moreso w/ thin liquids. Thin liquids boluses spilled into the pyriform sinuses w/ pharyngeal swallow initiation. Decreased, epiglottic inversion noted; resulting in coating along underneath side  of epiglottis.  PHARYNGEAL PHASE: (Pharyngeal function is normal if the bolus shows rapid, smooth, and continuous transit through the pharynx and there is no pharyngeal residue after the swallow)  **Overall Severity Rating: MILD. Valleculae residue noted; decreased  pharyngeal contraction and stripping wave activity. Pharyngeal residue cleared w/ f/u, Dry swallow.   LARYNGEAL PENETRATION: (Material entering into the laryngeal inlet/vestibule but not aspirated): trace amount of coating along underneath side of the Epiglottis x4 during/post swallow w/ thin liquids - most often seen during multiple, Large sips of thin liquids via Cup.  ASPIRATION: NONE ESOPHAGEAL PHASE: (Screening of the upper esophagus): bolus stasis in the lower Cervical Esophagus-below the level of shoulders(viewable); Esophageal phase Dysmotility  ASSESSMENT: At this study, pt appears to present w/ mild+ pharyngoesophageal phase dysphagia w/ a mild component of oral phase dysphagia in setting of known Esophageal phase Dysmotility(see history), per pt and chart history.   During the pharyngeal phase, pt exhibited a delayed pharyngeal swallow initiation moreso w/ thin liquid consistency w/ mildly decreased pharyngeal pressure during the swallow. NO aspiration occurred during this study. Thin liquids boluses spilled into the pyriform sinuses as the initiation of the pharyngeal swallow occurred. Laryngeal penetration of thin liquids occurred d/t incomplete, timely epiglottic inversion and tight closure, secondary to the delayed pharyngeal swallow initiation. This laryngeal penetration cleared most often w/ completion of the swallow and/or post a f/u Dry swallow occurring. A slight amount of coating along the underneath side of the epiglottis was viewed during exam. Pt was sensate to laryngeal penetration (throat clearing followed). Also, diffuse, min+ BOT and pharyngeal residue in the valleculae (and posterior wall x2) were noted post initial swallow suggestive of reduced BOT/pharyngeal contraction and stripping wave effectiveness. Pt also endorsed Sticky saliva and mouth/throat. When pt utilized strategy of f/u, Dry swallow, this reduced and/or cleared the pharyngeal residue b/t trials.  During the oral  phase, pt tended to bolus piecemeal but cleared oral cavity adequately given Time for f/u, dry swallow b/t trials. Diffuse, mild+ oral residue noted post initial swallow -- suspect impact from (Baseline) Dry mouth w/ Sticky saliva, as well as missing Dentition. Adequate bolus control w/ thin liquids. During the Esophageal phase, bolus stasis occurred in the lower Cervical Esophagus-below the level of shoulders (viewable area) indicating Esophageal phase Dysmotility. No immediate retrograde bolus activity to the level of the pharynx occurred. Pt indicated a globus-like feeling -- suspect impact of the retention of the bolus residue in the Esophagus. ANY Esophageal phase Dysmotility can impact the pharyngeal phase of swallowing and increase risk for Retrograde bolus activity back into the pharynx thus increasing risk for aspiration of REFLUX material.  Findings suggestive of results of laryngopharyngeal reflux (inflammation, edema, and resultant decreased sensation of the larynx and pharynx). Recommend f/u w/ GI for ongoing assessment/management/tx of Esophageal phase Dysmotility, GERD/REFLUX, and education on impact of Esophageal phase Dysmotility on pharyngoesophageal phase swallowing.  PLAN/RECOMMENDATIONS:  A. Diet: continue a Regular diet(foods Cut small and moistened well) w/ thin liquids; Pills in a Puree  B. Swallowing Precautions: general aspiration and REFLUX precautions; swallowing strategies to include f/u, Dry swallows w/ each bite/sip  C. Recommended consultation to: f/u w/ GI for Esophageal phase Dysmotility - potential Manometry; GERD management. PPI.   D. Therapy recommendations: None at this time. Encouraged pt to discuss w/ PCP options or Xerostomia; ice chips recommended to maintain moist oral cavity and reduce Sticky saliva/oral cavity.   E. Results and recommendations were discussed w/ pt; video viewed and questions answered;  precautions/strategies practiced; general REFLUX precautions  discussed w/ recommendation to f/u w/ GI.   Pharyngoesophageal dysphagia  Dysphagia, unspecified type - Plan: DG SWALLOW FUNC SPEECH PATH, DG SWALLOW [facility] SPEECH PATH        Problem List Patient Active Problem List   Diagnosis Date Noted   Sore throat 02/21/2022   Anemia, unspecified 09/21/2021   History of COVID-19 12/15/2019   Dysphagia    Esophageal dysmotility 07/21/2019   Anxiety about health 09/06/2018   Vitamin D deficiency 01/16/2017   Varicose veins of both lower extremities 07/20/2015   Urethral polyp 05/23/2014   Osteoporosis 09/30/2013   Menopause present, declines hormone replacement therapy 04/15/2012   Allergic rhinitis due to dust 04/14/2012   Encounter for preventive health examination 07/17/2011   GERD (gastroesophageal reflux disease) 06/07/2011        Orinda Kenner, MS, CCC-SLP Speech Language Pathologist [facility]; [place] [phone] ([phone]) [place], [place] 03/15/2022, 4:44 PM  [facility] [place], [place], [address] Phone: [phone]   Fax:     Name: [name] [name] MRN: [id number] Date of Birth: [birth year]

## 2022-03-22 ENCOUNTER — Ambulatory Visit
Admission: RE | Admit: 2022-03-22 | Discharge: 2022-03-22 | Disposition: A | Discharge status: Home / Self Care | Payer: Commercial Managed Care - PPO | Source: Ambulatory Visit | Attending: Gastroenterology | Admitting: Gastroenterology

## 2022-03-22 DIAGNOSIS — R198 Other specified symptoms and signs involving the digestive system and abdomen: Secondary | ICD-10-CM | POA: Diagnosis not present

## 2022-03-22 DIAGNOSIS — R1310 Dysphagia, unspecified: Secondary | ICD-10-CM | POA: Diagnosis not present

## 2022-03-22 DIAGNOSIS — M47812 Spondylosis without myelopathy or radiculopathy, cervical region: Secondary | ICD-10-CM | POA: Diagnosis not present

## 2022-03-22 DIAGNOSIS — I700 Atherosclerosis of aorta: Secondary | ICD-10-CM | POA: Diagnosis not present

## 2022-03-22 DIAGNOSIS — Z9889 Other specified postprocedural states: Secondary | ICD-10-CM | POA: Diagnosis not present

## 2022-03-22 MED ORDER — IOHEXOL 300 MG/ML  SOLN
75.0000 mL | Freq: Once | INTRAMUSCULAR | Status: AC | PRN
  Administered 2022-03-22: 75 mL via INTRAVENOUS

## 2022-03-23 ENCOUNTER — Ambulatory Visit: Payer: Commercial Managed Care - PPO | Admitting: Internal Medicine

## 2022-03-23 ENCOUNTER — Encounter: Payer: Self-pay | Admitting: Internal Medicine

## 2022-03-23 VITALS — BP 120/62 | HR 80 | Temp 97.7°F | Ht 64.0 in | Wt 132.4 lb

## 2022-03-23 DIAGNOSIS — K648 Other hemorrhoids: Secondary | ICD-10-CM | POA: Diagnosis not present

## 2022-03-23 DIAGNOSIS — K224 Dyskinesia of esophagus: Secondary | ICD-10-CM | POA: Diagnosis not present

## 2022-03-23 DIAGNOSIS — E559 Vitamin D deficiency, unspecified: Secondary | ICD-10-CM | POA: Diagnosis not present

## 2022-03-23 DIAGNOSIS — K5730 Diverticulosis of large intestine without perforation or abscess without bleeding: Secondary | ICD-10-CM

## 2022-03-23 DIAGNOSIS — R1319 Other dysphagia: Secondary | ICD-10-CM | POA: Diagnosis not present

## 2022-03-23 DIAGNOSIS — K121 Other forms of stomatitis: Secondary | ICD-10-CM | POA: Diagnosis not present

## 2022-03-23 DIAGNOSIS — D649 Anemia, unspecified: Secondary | ICD-10-CM

## 2022-03-23 DIAGNOSIS — Z23 Encounter for immunization: Secondary | ICD-10-CM | POA: Diagnosis not present

## 2022-03-23 DIAGNOSIS — Z1231 Encounter for screening mammogram for malignant neoplasm of breast: Secondary | ICD-10-CM | POA: Diagnosis not present

## 2022-03-23 DIAGNOSIS — Z0000 Encounter for general adult medical examination without abnormal findings: Secondary | ICD-10-CM | POA: Diagnosis not present

## 2022-03-23 LAB — COMPREHENSIVE METABOLIC PANEL
ALT: 23 U/L (ref 0–35)
AST: 26 U/L (ref 0–37)
Albumin: 3.8 g/dL (ref 3.5–5.2)
Alkaline Phosphatase: 57 U/L (ref 39–117)
BUN: 16 mg/dL (ref 6–23)
CO2: 32 mEq/L (ref 19–32)
Calcium: 8.8 mg/dL (ref 8.4–10.5)
Chloride: 102 mEq/L (ref 96–112)
Creatinine, Ser: 0.55 mg/dL (ref 0.40–1.20)
GFR: 96.44 mL/min (ref >60.00)
Glucose, Bld: 86 mg/dL (ref 70–99)
Potassium: 3.9 mEq/L (ref 3.5–5.1)
Sodium: 140 mEq/L (ref 135–145)
Total Bilirubin: 0.3 mg/dL (ref 0.2–1.2)
Total Protein: 6.8 g/dL (ref 6.0–8.3)

## 2022-03-23 LAB — CBC WITH DIFFERENTIAL/PLATELET
Basophils Absolute: 0 10*3/uL (ref 0.0–0.1)
Basophils Relative: 0.6 % (ref 0.0–3.0)
Eosinophils Absolute: 0.1 10*3/uL (ref 0.0–0.7)
Eosinophils Relative: 1.5 % (ref 0.0–5.0)
HCT: 36.3 % (ref 36.0–46.0)
Hemoglobin: 11.9 g/dL — ABNORMAL LOW (ref 12.0–15.0)
Lymphocytes Relative: 26.1 % (ref 12.0–46.0)
Lymphs Abs: 1.1 10*3/uL (ref 0.7–4.0)
MCHC: 32.7 g/dL (ref 30.0–36.0)
MCV: 85.2 fl (ref 78.0–100.0)
Monocytes Absolute: 0.4 10*3/uL (ref 0.1–1.0)
Monocytes Relative: 10.3 % (ref 3.0–12.0)
Neutro Abs: 2.6 10*3/uL (ref 1.4–7.7)
Neutrophils Relative %: 61.5 % (ref 43.0–77.0)
Platelets: 209 10*3/uL (ref 150.0–400.0)
RBC: 4.25 Mil/uL (ref 3.87–5.11)
RDW: 14.3 % (ref 11.5–15.5)
WBC: 4.3 10*3/uL (ref 4.0–10.5)

## 2022-03-23 LAB — VITAMIN D 25 HYDROXY (VIT D DEFICIENCY, FRACTURES): VITD: 46.41 ng/mL (ref 30.00–100.00)

## 2022-03-23 NOTE — Patient Instructions (Signed)
You received your pneumonia vaccine today.  A sore red arm is a common side effect for a week (hopefully less)  DEXA scan due in August 2024   Follow up with GI about the swallow study and the CT .  The motility issues are not likely to improve by changing your PPI

## 2022-03-23 NOTE — Assessment & Plan Note (Signed)
EGD was repeated Dec 2021 and UES was tight.  CT neck was ordered by GI results pending

## 2022-03-23 NOTE — Assessment & Plan Note (Signed)

## 2022-03-23 NOTE — Progress Notes (Unsigned)
Patient ID: [name] [name], female    DOB: 11/15/1955  Age: 67 y.o. MRN: [id number]  The patient is here for annual preventive examination and management of other chronic and acute problems.   The risk factors are reflected in the social history.   The roster of all physicians providing medical care to patient - is listed in the Snapshot section of the chart.   Activities of daily living:  The patient is 100% independent in all ADLs: dressing, toileting, feeding as well as independent mobility   Home safety : The patient has smoke detectors in the home. They wear seatbelts.  There are no unsecured firearms at home. There is no violence in the home.    There is no risks for hepatitis, STDs or HIV. There is no   history of blood transfusion. They have no travel history to infectious disease endemic areas of the world.   The patient has seen their dentist in the last six month. They have seen their eye doctor in the last year. The patinet  denies slight hearing difficulty with regard to whispered voices and some television programs.  They have deferred audiologic testing in the last year.  They do not  have excessive sun exposure. Discussed the need for sun protection: hats, long sleeves and use of sunscreen if there is significant sun exposure.    Diet: the importance of a healthy diet is discussed. They do have a healthy diet.   The benefits of regular aerobic exercise were discussed. The patient  exercises  3 to 5 days per week  for  60 minutes.    Depression screen: there are no signs or vegative symptoms of depression- irritability, change in appetite, anhedonia, sadness/tearfullness.   The following portions of the patient's history were reviewed and updated as appropriate: allergies, current medications, past family history, past medical history,  past surgical history, past social history  and problem list.   Visual acuity was not assessed per patient preference since the patient has  regular follow up with an  ophthalmologist. Hearing and body mass index were assessed and reviewed.    During the course of the visit the patient was educated and counseled about appropriate screening and preventive services including : fall prevention , diabetes screening, nutrition counseling, colorectal cancer screening, and recommended immunizations.    Chief Complaint:   1) Workup for dysphagia in progress by [facility]. Virgina Jock,  seen Oct 12 ) EGD noted .  CT soft tissue neck done yesterday,  and modified barium swallow for swallow function eval was done by speech path Dec 28 ,  again noting dysmotility of the esophagus and mild delay swallow reflex  awa mild delay in pharyngeal phase . She was advised to take pills in a puree. Continue regular diet/thin liquids  and was advised to follow up with GI for manometry  2) ulcerations on left tonsillar pillar  , recurrent .  Attributed to irritants (inhalational) from kitchen renovation . Managed by [facility] with [facility]wash,    salt  after gargles   Review of Symptoms  Patient denies headache, fevers, malaise, unintentional weight loss, skin rash, eye pain, sinus congestion and sinus pain, sore throat, dysphagia,  hemoptysis , cough, dyspnea, wheezing, chest pain, palpitations, orthopnea, edema, abdominal pain, nausea, melena, diarrhea, constipation, flank pain, dysuria, hematuria, urinary  Frequency, nocturia, numbness, tingling, seizures,  Focal weakness, Loss of consciousness,  Tremor, insomnia, depression, anxiety, and suicidal ideation.    Physical Exam:  BP 120/62  Pulse 80   Temp 97.7 F (36.5 C) (Oral)   Ht '5\' 4"'$  (1.626 m)   Wt 132 lb 6.4 oz (60.1 kg)   LMP 11/20/2012   SpO2 96%   BMI 22.73 kg/m    Physical Exam Vitals reviewed.  Constitutional:      General: She is not in acute distress.    Appearance: Normal appearance. She is well-developed and normal weight. She is not ill-appearing, toxic-appearing or diaphoretic.  HENT:      Head: Normocephalic.     Right Ear: Tympanic membrane, ear canal and external ear normal. There is no impacted cerumen.     Left Ear: Tympanic membrane, ear canal and external ear normal. There is no impacted cerumen.     Nose: Nose normal.     Mouth/Throat:     Mouth: Mucous membranes are moist.     Tongue: No lesions.     Palate: Lesions present. No mass.     Pharynx: Oropharynx is clear.     Tonsils: Tonsillar exudate present.      Comments: Ulceration of left tonsillar pillar  Eyes:     General: No scleral icterus.       Right eye: No discharge.        Left eye: No discharge.     Conjunctiva/sclera: Conjunctivae normal.     Pupils: Pupils are equal, round, and reactive to light.  Neck:     Thyroid: No thyromegaly.     Vascular: No carotid bruit or JVD.  Cardiovascular:     Rate and Rhythm: Normal rate and regular rhythm.     Heart sounds: Normal heart sounds.  Pulmonary:     Effort: Pulmonary effort is normal. No respiratory distress.     Breath sounds: Normal breath sounds.  Chest:  Breasts:    Breasts are symmetrical.     Right: Normal. No swelling, inverted nipple, mass, nipple discharge, skin change or tenderness.     Left: Normal. No swelling, inverted nipple, mass, nipple discharge, skin change or tenderness.  Abdominal:     General: Bowel sounds are normal.     Palpations: Abdomen is soft. There is no mass.     Tenderness: There is no abdominal tenderness. There is no guarding or rebound.  Musculoskeletal:        General: Normal range of motion.     Cervical back: Normal range of motion and neck supple.  Lymphadenopathy:     Cervical: No cervical adenopathy.     Upper Body:     Right upper body: No supraclavicular, axillary or pectoral adenopathy.     Left upper body: No supraclavicular, axillary or pectoral adenopathy.  Skin:    General: Skin is warm and dry.  Neurological:     General: No focal deficit present.     Mental Status: She is alert and  oriented to person, place, and time. Mental status is at baseline.  Psychiatric:        Mood and Affect: Mood normal.        Behavior: Behavior normal.        Thought Content: Thought content normal.        Judgment: Judgment normal.     Assessment and Plan: Encounter for screening mammogram for malignant neoplasm of breast -     3D Screening Mammogram, Left and Right; Future  Esophageal dysphagia Assessment & Plan: EGD was repeated Dec 2021 and UES was tight.  CT neck was ordered by GI results pending  Encounter for preventive health examination Assessment & Plan: age appropriate education and counseling updated, referrals for preventative services and immunizations addressed, dietary and smoking counseling addressed, most recent labs reviewed.  I have personally reviewed and have noted:   1) the patient's medical and social history 2) The pt's use of alcohol, tobacco, and illicit drugs 3) The patient's current medications and supplements 4) Functional ability including ADL's, fall risk, home safety risk, hearing and visual impairment 5) Diet and physical activities 6) Evidence for depression or mood disorder 7) The patient's height, weight, and BMI have been recorded in the chart  I have made referrals, and provided counseling and education based on review of the above    Diverticulosis of colon, acquired  Internal hemorrhoid  Normocytic anemia -     Comprehensive metabolic panel -     CBC with Differential/Platelet  Vitamin D deficiency Assessment & Plan: Resolved ,  continue current supplementation   Orders: -     VITAMIN D 25 Hydroxy (Vit-D Deficiency, Fractures)  Ulceration of oral mucosa Assessment & Plan: Recurrent,  left tonsillar pillar.  HS types 1 and 2 ruled out    Orders: -     HSV(herpes simplex vrs) 1+2 ab-IgG  Need for pneumococcal 20-valent conjugate vaccination -     Pneumococcal conjugate vaccine 20-valent  Esophageal  dysmotility Assessment & Plan: Reviewed the results of recent swallow evaluation .  She has no dysphagia with foods , only pills   Anemia, unspecified type Assessment & Plan: Resolved by current labs Lab Results  Component Value Date   WBC 4.3 03/23/2022   HGB 11.9 (L) 03/23/2022   HCT 36.3 03/23/2022   MCV 85.2 03/23/2022   PLT 209.0 03/23/2022        Return in about 6 months (around 09/21/2022).  [name], MD

## 2022-03-24 DIAGNOSIS — K121 Other forms of stomatitis: Secondary | ICD-10-CM | POA: Insufficient documentation

## 2022-03-24 LAB — HSV(HERPES SIMPLEX VRS) I + II AB-IGG
HAV 1 IGG,TYPE SPECIFIC AB: <0.9 index
HSV 2 IGG,TYPE SPECIFIC AB: <0.9 index

## 2022-03-24 NOTE — Assessment & Plan Note (Signed)
Reviewed the results of recent swallow evaluation .  She has no dysphagia with foods , only pills

## 2022-03-24 NOTE — Assessment & Plan Note (Signed)
Resolved ,  continue current supplementation

## 2022-03-24 NOTE — Assessment & Plan Note (Signed)
Resolved by current labs Lab Results  Component Value Date   WBC 4.3 03/23/2022   HGB 11.9 (L) 03/23/2022   HCT 36.3 03/23/2022   MCV 85.2 03/23/2022   PLT 209.0 03/23/2022

## 2022-03-24 NOTE — Assessment & Plan Note (Addendum)
Recurrent,  left tonsillar pillar.  HS types 1 and 2 ruled out

## 2022-04-03 DIAGNOSIS — H35362 Drusen (degenerative) of macula, left eye: Secondary | ICD-10-CM | POA: Diagnosis not present

## 2022-04-03 DIAGNOSIS — H5212 Myopia, left eye: Secondary | ICD-10-CM | POA: Diagnosis not present

## 2022-04-03 DIAGNOSIS — H524 Presbyopia: Secondary | ICD-10-CM | POA: Diagnosis not present

## 2022-04-03 DIAGNOSIS — H269 Unspecified cataract: Secondary | ICD-10-CM | POA: Diagnosis not present

## 2022-04-07 NOTE — Addendum Note (Signed)
Encounter addended by: Antonieta Iba, CCC-SLP on: 04/07/2022 1:23 PM  Actions taken: Clinical Note Signed

## 2022-04-25 ENCOUNTER — Other Ambulatory Visit: Payer: Self-pay

## 2022-04-25 ENCOUNTER — Ambulatory Visit
Admission: RE | Admit: 2022-04-25 | Discharge: 2022-04-25 | Disposition: A | Discharge status: Home / Self Care | Payer: Commercial Managed Care - PPO | Source: Ambulatory Visit | Attending: Internal Medicine | Admitting: Internal Medicine

## 2022-04-25 DIAGNOSIS — Z1231 Encounter for screening mammogram for malignant neoplasm of breast: Secondary | ICD-10-CM | POA: Insufficient documentation

## 2022-05-01 ENCOUNTER — Other Ambulatory Visit: Payer: Self-pay | Admitting: Family

## 2022-05-01 ENCOUNTER — Other Ambulatory Visit: Payer: Self-pay

## 2022-05-01 DIAGNOSIS — K2100 Gastro-esophageal reflux disease with esophagitis, without bleeding: Secondary | ICD-10-CM

## 2022-05-01 MED ORDER — OMEPRAZOLE 40 MG PO CPDR
40.0000 mg | DELAYED_RELEASE_CAPSULE | Freq: Every day | ORAL | 0 refills | Status: DC
  Filled 2022-05-01: qty 90, 90d supply, fill #0

## 2022-05-03 ENCOUNTER — Other Ambulatory Visit: Payer: Self-pay

## 2022-05-04 ENCOUNTER — Other Ambulatory Visit: Payer: Self-pay

## 2022-05-30 ENCOUNTER — Telehealth: Payer: Self-pay | Admitting: Internal Medicine

## 2022-05-30 DIAGNOSIS — Z79899 Other long term (current) drug therapy: Secondary | ICD-10-CM

## 2022-05-30 DIAGNOSIS — E785 Hyperlipidemia, unspecified: Secondary | ICD-10-CM

## 2022-05-30 DIAGNOSIS — E559 Vitamin D deficiency, unspecified: Secondary | ICD-10-CM

## 2022-05-30 DIAGNOSIS — D649 Anemia, unspecified: Secondary | ICD-10-CM

## 2022-05-30 DIAGNOSIS — R7301 Impaired fasting glucose: Secondary | ICD-10-CM

## 2022-05-30 NOTE — Telephone Encounter (Signed)
Patient has been scheduled for Physical. She would like to have labs before her July physical, no lab orders.

## 2022-05-30 NOTE — Telephone Encounter (Signed)
I have pended labs for your approval.  

## 2022-06-29 ENCOUNTER — Other Ambulatory Visit: Payer: Self-pay

## 2022-08-23 ENCOUNTER — Other Ambulatory Visit: Payer: Self-pay | Admitting: Internal Medicine

## 2022-08-23 ENCOUNTER — Other Ambulatory Visit: Payer: Self-pay

## 2022-08-23 DIAGNOSIS — K2100 Gastro-esophageal reflux disease with esophagitis, without bleeding: Secondary | ICD-10-CM

## 2022-08-24 ENCOUNTER — Other Ambulatory Visit: Payer: Self-pay

## 2022-08-24 MED FILL — Omeprazole Cap Delayed Release 40 MG: ORAL | 90 days supply | Qty: 90 | Fill #0 | Status: AC

## 2022-09-03 ENCOUNTER — Other Ambulatory Visit: Payer: Self-pay

## 2022-09-19 ENCOUNTER — Other Ambulatory Visit (INDEPENDENT_AMBULATORY_CARE_PROVIDER_SITE_OTHER): Payer: Commercial Managed Care - PPO

## 2022-09-19 DIAGNOSIS — D649 Anemia, unspecified: Secondary | ICD-10-CM | POA: Diagnosis not present

## 2022-09-19 DIAGNOSIS — E785 Hyperlipidemia, unspecified: Secondary | ICD-10-CM | POA: Diagnosis not present

## 2022-09-19 DIAGNOSIS — Z79899 Other long term (current) drug therapy: Secondary | ICD-10-CM | POA: Diagnosis not present

## 2022-09-19 DIAGNOSIS — E559 Vitamin D deficiency, unspecified: Secondary | ICD-10-CM

## 2022-09-19 DIAGNOSIS — R7301 Impaired fasting glucose: Secondary | ICD-10-CM

## 2022-09-19 LAB — CBC WITH DIFFERENTIAL/PLATELET
Basophils Absolute: 0 10*3/uL (ref 0.0–0.1)
Basophils Relative: 0.4 % (ref 0.0–3.0)
Eosinophils Absolute: 0.1 10*3/uL (ref 0.0–0.7)
Eosinophils Relative: 2.2 % (ref 0.0–5.0)
HCT: 36.7 % (ref 36.0–46.0)
Hemoglobin: 11.9 g/dL — ABNORMAL LOW (ref 12.0–15.0)
Lymphocytes Relative: 29.8 % (ref 12.0–46.0)
Lymphs Abs: 1.6 10*3/uL (ref 0.7–4.0)
MCHC: 32.4 g/dL (ref 30.0–36.0)
MCV: 87.5 fl (ref 78.0–100.0)
Monocytes Absolute: 0.7 10*3/uL (ref 0.1–1.0)
Monocytes Relative: 12.4 % — ABNORMAL HIGH (ref 3.0–12.0)
Neutro Abs: 2.9 10*3/uL (ref 1.4–7.7)
Neutrophils Relative %: 55.2 % (ref 43.0–77.0)
Platelets: 196 10*3/uL (ref 150.0–400.0)
RBC: 4.19 Mil/uL (ref 3.87–5.11)
RDW: 13.9 % (ref 11.5–15.5)
WBC: 5.2 10*3/uL (ref 4.0–10.5)

## 2022-09-19 LAB — COMPREHENSIVE METABOLIC PANEL
ALT: 11 U/L (ref 0–35)
AST: 16 U/L (ref 0–37)
Albumin: 3.5 g/dL (ref 3.5–5.2)
Alkaline Phosphatase: 55 U/L (ref 39–117)
BUN: 18 mg/dL (ref 6–23)
CO2: 33 mEq/L — ABNORMAL HIGH (ref 19–32)
Calcium: 8.9 mg/dL (ref 8.4–10.5)
Chloride: 103 mEq/L (ref 96–112)
Creatinine, Ser: 0.61 mg/dL (ref 0.40–1.20)
GFR: 93.74 mL/min (ref >60.00)
Glucose, Bld: 91 mg/dL (ref 70–99)
Potassium: 4.5 mEq/L (ref 3.5–5.1)
Sodium: 140 mEq/L (ref 135–145)
Total Bilirubin: 0.4 mg/dL (ref 0.2–1.2)
Total Protein: 6.7 g/dL (ref 6.0–8.3)

## 2022-09-19 LAB — LIPID PANEL
Cholesterol: 153 mg/dL (ref 0–200)
HDL: 63.1 mg/dL (ref >39.00)
LDL Cholesterol: 76 mg/dL (ref 0–99)
NonHDL: 90.19
Total CHOL/HDL Ratio: 2
Triglycerides: 70 mg/dL (ref 0.0–149.0)
VLDL: 14 mg/dL (ref 0.0–40.0)

## 2022-09-19 LAB — VITAMIN D 25 HYDROXY (VIT D DEFICIENCY, FRACTURES): VITD: 46.79 ng/mL (ref 30.00–100.00)

## 2022-09-19 LAB — TSH: TSH: 1.84 u[IU]/mL (ref 0.35–5.50)

## 2022-09-19 LAB — LDL CHOLESTEROL, DIRECT: Direct LDL: 72 mg/dL

## 2022-09-19 LAB — HEMOGLOBIN A1C: Hgb A1c MFr Bld: 5.3 % (ref 4.6–6.5)

## 2022-09-21 ENCOUNTER — Ambulatory Visit: Payer: Commercial Managed Care - PPO | Admitting: Internal Medicine

## 2022-09-24 ENCOUNTER — Ambulatory Visit (INDEPENDENT_AMBULATORY_CARE_PROVIDER_SITE_OTHER): Payer: Commercial Managed Care - PPO | Admitting: Internal Medicine

## 2022-09-24 ENCOUNTER — Other Ambulatory Visit: Payer: Self-pay

## 2022-09-24 ENCOUNTER — Encounter: Payer: Self-pay | Admitting: Internal Medicine

## 2022-09-24 VITALS — BP 116/60 | HR 81 | Temp 98.3°F | Ht 64.0 in | Wt 134.2 lb

## 2022-09-24 DIAGNOSIS — Z0000 Encounter for general adult medical examination without abnormal findings: Secondary | ICD-10-CM | POA: Diagnosis not present

## 2022-09-24 DIAGNOSIS — M810 Age-related osteoporosis without current pathological fracture: Secondary | ICD-10-CM | POA: Diagnosis not present

## 2022-09-24 DIAGNOSIS — R1319 Other dysphagia: Secondary | ICD-10-CM

## 2022-09-24 DIAGNOSIS — K2100 Gastro-esophageal reflux disease with esophagitis, without bleeding: Secondary | ICD-10-CM | POA: Diagnosis not present

## 2022-09-24 DIAGNOSIS — K224 Dyskinesia of esophagus: Secondary | ICD-10-CM

## 2022-09-24 MED ORDER — OMEPRAZOLE 40 MG PO CPDR
40.0000 mg | DELAYED_RELEASE_CAPSULE | Freq: Every day | ORAL | 0 refills | Status: DC
Start: 2022-09-24 — End: 2023-02-18
  Filled 2022-09-24 – 2022-11-20 (×2): qty 90, 90d supply, fill #0

## 2022-09-24 MED ORDER — RALOXIFENE HCL 60 MG PO TABS
60.0000 mg | ORAL_TABLET | Freq: Every day | ORAL | 3 refills | Status: AC
  Filled 2022-09-24: qty 90, fill #0
  Filled 2022-11-20: qty 90, 90d supply, fill #0
  Filled 2023-02-18: qty 90, 90d supply, fill #1
  Filled 2023-08-08: qty 90, 90d supply, fill #2

## 2022-09-24 NOTE — Patient Instructions (Signed)
There is nothing worrisome on your labs.  Do not increase your iron beyond 2 doses per week  I will join you in praying for [name] [name] and [name] [name]   DEXA has been ordered and can be scheduled  in late August

## 2022-09-24 NOTE — Assessment & Plan Note (Addendum)
Reviewed results of Barium swallow:  normal .  CT neck and EGD :  normal.  Manometery offered by GI but deferred

## 2022-09-24 NOTE — Progress Notes (Unsigned)
Patient ID: [name] [name], female    DOB: 05-03-1955  Age: 67 y.o. MRN: [id number]  The patient is here for annual preventive examination and management of other chronic and acute problems.   The risk factors are reflected in the social history.   The roster of all physicians providing medical care to patient - is listed in the Snapshot section of the chart.   Activities of daily living:  The patient is 100% independent in all ADLs: dressing, toileting, feeding as well as independent mobility   Home safety : The patient has smoke detectors in the home. They wear seatbelts.  There are no unsecured firearms at home. There is no violence in the home.    There is no risks for hepatitis, STDs or HIV. There is no   history of blood transfusion. They have no travel history to infectious disease endemic areas of the world.   The patient has seen their dentist in the last six month. They have seen their eye doctor in the last year. The patinet  denies slight hearing difficulty with regard to whispered voices and some television programs.  They have deferred audiologic testing in the last year.  They do not  have excessive sun exposure. Discussed the need for sun protection: hats, long sleeves and use of sunscreen if there is significant sun exposure.    Diet: the importance of a healthy diet is discussed. They do have a healthy diet.   The benefits of regular aerobic exercise were discussed. The patient  exercises  3 to 5 days per week  for  60 minutes.    Depression screen: there are no signs or vegative symptoms of depression- irritability, change in appetite, anhedonia, sadness/tearfullness.   The following portions of the patient's history were reviewed and updated as appropriate: allergies, current medications, past family history, past medical history,  past surgical history, past social history  and problem list.   Visual acuity was not assessed per patient preference since the patient has  regular follow up with an  ophthalmologist. Hearing and body mass index were assessed and reviewed.    During the course of the visit the patient was educated and counseled about appropriate screening and preventive services including : fall prevention , diabetes screening, nutrition counseling, colorectal cancer screening, and recommended immunizations.    Chief Complaint:      Review of Symptoms  Patient denies headache, fevers, malaise, unintentional weight loss, skin rash, eye pain, sinus congestion and sinus pain, sore throat, dysphagia,  hemoptysis , cough, dyspnea, wheezing, chest pain, palpitations, orthopnea, edema, abdominal pain, nausea, melena, diarrhea, constipation, flank pain, dysuria, hematuria, urinary  Frequency, nocturia, numbness, tingling, seizures,  Focal weakness, Loss of consciousness,  Tremor, insomnia, depression, anxiety, and suicidal ideation.    Physical Exam:  BP 116/60   Pulse 81   Temp 98.3 F (36.8 C) (Oral)   Ht 5\' 4"  (1.626 m)   Wt 134 lb 3.2 oz (60.9 kg)   LMP 11/20/2012   SpO2 97%   BMI 23.04 kg/m    Physical Exam  Assessment and Plan: Gastroesophageal reflux disease with esophagitis, unspecified whether hemorrhage -     Omeprazole; Take 1 capsule (40 mg total) by mouth daily.  Dispense: 90 capsule; Refill: 0  Other orders -     Raloxifene HCl; TAKE 1 TABLET (60 MG TOTAL) BY MOUTH DAILY.  Dispense: 90 tablet; Refill: 3    No follow-ups on file.  Sherlene Shams, MD

## 2022-09-24 NOTE — Assessment & Plan Note (Signed)
BMD on most recent DEXA August 2022  is improved .  Continue raloxifene and calcium. She has pill dysphagia but has been able to take the raloxifene.

## 2022-09-25 NOTE — Assessment & Plan Note (Signed)
Reviewed the results of recent swallow evaluation .  She has no dysphagia with foods , only pills 

## 2022-09-25 NOTE — Assessment & Plan Note (Signed)

## 2022-11-14 ENCOUNTER — Encounter: Payer: Self-pay | Admitting: Internal Medicine

## 2022-11-21 ENCOUNTER — Other Ambulatory Visit: Payer: Self-pay

## 2022-11-27 ENCOUNTER — Ambulatory Visit
Admission: RE | Admit: 2022-11-27 | Discharge: 2022-11-27 | Disposition: A | Discharge status: Home / Self Care | Payer: Commercial Managed Care - PPO | Source: Ambulatory Visit | Attending: Internal Medicine | Admitting: Internal Medicine

## 2022-11-27 DIAGNOSIS — M810 Age-related osteoporosis without current pathological fracture: Secondary | ICD-10-CM | POA: Diagnosis not present

## 2022-11-27 DIAGNOSIS — Z780 Asymptomatic menopausal state: Secondary | ICD-10-CM | POA: Diagnosis not present

## 2023-01-02 ENCOUNTER — Other Ambulatory Visit: Payer: Self-pay

## 2023-01-10 ENCOUNTER — Encounter: Payer: Self-pay | Admitting: Internal Medicine

## 2023-01-11 MED ORDER — NYSTATIN 100000 UNIT/ML MT SUSP
5.0000 mL | OROMUCOSAL | 2 refills | Status: DC | PRN
  Filled 2023-01-11: qty 60, 4d supply, fill #0

## 2023-01-13 ENCOUNTER — Other Ambulatory Visit: Payer: Self-pay

## 2023-01-17 ENCOUNTER — Other Ambulatory Visit: Payer: Self-pay

## 2023-01-18 ENCOUNTER — Other Ambulatory Visit: Payer: Self-pay

## 2023-01-20 NOTE — Addendum Note (Signed)
Addended by: Sherlene Shams on: 01/20/2023 06:51 PM   Modules accepted: Orders

## 2023-01-24 ENCOUNTER — Other Ambulatory Visit: Payer: Self-pay

## 2023-01-25 ENCOUNTER — Other Ambulatory Visit: Payer: Self-pay

## 2023-02-05 ENCOUNTER — Other Ambulatory Visit: Payer: Self-pay

## 2023-02-18 ENCOUNTER — Other Ambulatory Visit: Payer: Self-pay | Admitting: Internal Medicine

## 2023-02-18 ENCOUNTER — Other Ambulatory Visit: Payer: Self-pay

## 2023-02-18 DIAGNOSIS — K2100 Gastro-esophageal reflux disease with esophagitis, without bleeding: Secondary | ICD-10-CM

## 2023-02-18 MED FILL — Omeprazole Cap Delayed Release 40 MG: ORAL | 90 days supply | Qty: 90 | Fill #0 | Status: AC

## 2023-02-20 NOTE — Telephone Encounter (Signed)
noted 

## 2023-02-25 ENCOUNTER — Telehealth: Payer: Self-pay

## 2023-02-25 NOTE — Telephone Encounter (Signed)
Error

## 2023-03-18 ENCOUNTER — Other Ambulatory Visit: Payer: Self-pay

## 2023-03-21 ENCOUNTER — Telehealth: Payer: Self-pay | Admitting: Internal Medicine

## 2023-03-21 DIAGNOSIS — Z79899 Other long term (current) drug therapy: Secondary | ICD-10-CM

## 2023-03-21 NOTE — Telephone Encounter (Signed)
 Pt is aware.

## 2023-03-21 NOTE — Telephone Encounter (Signed)
 Patient is calling in to see if she needs lab work done before her appt

## 2023-03-21 NOTE — Telephone Encounter (Signed)
 I have pended labs for your approval.

## 2023-03-22 DIAGNOSIS — H269 Unspecified cataract: Secondary | ICD-10-CM | POA: Diagnosis not present

## 2023-03-22 DIAGNOSIS — H5212 Myopia, left eye: Secondary | ICD-10-CM | POA: Diagnosis not present

## 2023-03-22 DIAGNOSIS — H524 Presbyopia: Secondary | ICD-10-CM | POA: Diagnosis not present

## 2023-03-22 DIAGNOSIS — H35363 Drusen (degenerative) of macula, bilateral: Secondary | ICD-10-CM | POA: Diagnosis not present

## 2023-03-27 ENCOUNTER — Other Ambulatory Visit: Payer: Self-pay

## 2023-03-27 ENCOUNTER — Encounter: Payer: Self-pay | Admitting: Internal Medicine

## 2023-03-27 ENCOUNTER — Ambulatory Visit: Payer: Commercial Managed Care - PPO | Admitting: Internal Medicine

## 2023-03-27 VITALS — BP 122/60 | HR 84 | Ht 64.0 in | Wt 135.2 lb

## 2023-03-27 DIAGNOSIS — K2100 Gastro-esophageal reflux disease with esophagitis, without bleeding: Secondary | ICD-10-CM

## 2023-03-27 DIAGNOSIS — D509 Iron deficiency anemia, unspecified: Secondary | ICD-10-CM

## 2023-03-27 DIAGNOSIS — R4589 Other symptoms and signs involving emotional state: Secondary | ICD-10-CM

## 2023-03-27 DIAGNOSIS — M810 Age-related osteoporosis without current pathological fracture: Secondary | ICD-10-CM

## 2023-03-27 DIAGNOSIS — K224 Dyskinesia of esophagus: Secondary | ICD-10-CM | POA: Diagnosis not present

## 2023-03-27 DIAGNOSIS — E785 Hyperlipidemia, unspecified: Secondary | ICD-10-CM

## 2023-03-27 DIAGNOSIS — Z1231 Encounter for screening mammogram for malignant neoplasm of breast: Secondary | ICD-10-CM

## 2023-03-27 DIAGNOSIS — E611 Iron deficiency: Secondary | ICD-10-CM | POA: Diagnosis not present

## 2023-03-27 DIAGNOSIS — E559 Vitamin D deficiency, unspecified: Secondary | ICD-10-CM

## 2023-03-27 DIAGNOSIS — R1319 Other dysphagia: Secondary | ICD-10-CM | POA: Diagnosis not present

## 2023-03-27 DIAGNOSIS — I8393 Asymptomatic varicose veins of bilateral lower extremities: Secondary | ICD-10-CM | POA: Diagnosis not present

## 2023-03-27 LAB — CBC WITH DIFFERENTIAL/PLATELET
Basophils Absolute: 0 10*3/uL (ref 0.0–0.1)
Basophils Relative: 0.5 % (ref 0.0–3.0)
Eosinophils Absolute: 0.1 10*3/uL (ref 0.0–0.7)
Eosinophils Relative: 1.4 % (ref 0.0–5.0)
HCT: 36.7 % (ref 36.0–46.0)
Hemoglobin: 11.9 g/dL — ABNORMAL LOW (ref 12.0–15.0)
Lymphocytes Relative: 27.8 % (ref 12.0–46.0)
Lymphs Abs: 1.2 10*3/uL (ref 0.7–4.0)
MCHC: 32.3 g/dL (ref 30.0–36.0)
MCV: 86.8 fL (ref 78.0–100.0)
Monocytes Absolute: 0.5 10*3/uL (ref 0.1–1.0)
Monocytes Relative: 11.9 % (ref 3.0–12.0)
Neutro Abs: 2.5 10*3/uL (ref 1.4–7.7)
Neutrophils Relative %: 58.4 % (ref 43.0–77.0)
Platelets: 220 10*3/uL (ref 150.0–400.0)
RBC: 4.23 Mil/uL (ref 3.87–5.11)
RDW: 13.5 % (ref 11.5–15.5)
WBC: 4.2 10*3/uL (ref 4.0–10.5)

## 2023-03-27 LAB — LIPID PANEL
Cholesterol: 166 mg/dL (ref 0–200)
HDL: 67 mg/dL (ref >39.00)
LDL Cholesterol: 84 mg/dL (ref 0–99)
NonHDL: 98.67
Total CHOL/HDL Ratio: 2
Triglycerides: 73 mg/dL (ref 0.0–149.0)
VLDL: 14.6 mg/dL (ref 0.0–40.0)

## 2023-03-27 LAB — VITAMIN D 25 HYDROXY (VIT D DEFICIENCY, FRACTURES): VITD: 45.19 ng/mL (ref 30.00–100.00)

## 2023-03-27 LAB — COMPREHENSIVE METABOLIC PANEL
ALT: 11 U/L (ref 0–35)
AST: 16 U/L (ref 0–37)
Albumin: 3.7 g/dL (ref 3.5–5.2)
Alkaline Phosphatase: 62 U/L (ref 39–117)
BUN: 21 mg/dL (ref 6–23)
CO2: 32 meq/L (ref 19–32)
Calcium: 8.8 mg/dL (ref 8.4–10.5)
Chloride: 102 meq/L (ref 96–112)
Creatinine, Ser: 0.54 mg/dL (ref 0.40–1.20)
GFR: 96.18 mL/min (ref >60.00)
Glucose, Bld: 86 mg/dL (ref 70–99)
Potassium: 4.1 meq/L (ref 3.5–5.1)
Sodium: 140 meq/L (ref 135–145)
Total Bilirubin: 0.3 mg/dL (ref 0.2–1.2)
Total Protein: 6.8 g/dL (ref 6.0–8.3)

## 2023-03-27 LAB — IBC + FERRITIN
Ferritin: 7.2 ng/mL — ABNORMAL LOW (ref 10.0–291.0)
Iron: 70 ug/dL (ref 42–145)
Saturation Ratios: 15.4 % — ABNORMAL LOW (ref 20.0–50.0)
TIBC: 455 ug/dL — ABNORMAL HIGH (ref 250.0–450.0)
Transferrin: 325 mg/dL (ref 212.0–360.0)

## 2023-03-27 LAB — LDL CHOLESTEROL, DIRECT: Direct LDL: 76 mg/dL

## 2023-03-27 MED ORDER — OMEPRAZOLE 40 MG PO CPDR
40.0000 mg | DELAYED_RELEASE_CAPSULE | Freq: Every day | ORAL | 0 refills | Status: DC
  Filled 2023-03-27 – 2023-05-23 (×2): qty 90, 90d supply, fill #0

## 2023-03-27 MED ORDER — TRIAMCINOLONE ACETONIDE 0.1 % EX CREA
1.0000 | TOPICAL_CREAM | Freq: Two times a day (BID) | CUTANEOUS | 0 refills | Status: DC
  Filled 2023-03-27: qty 30, 15d supply, fill #0

## 2023-03-27 MED ORDER — NYSTATIN 100000 UNIT/ML MT SUSP
10.0000 mL | Freq: Three times a day (TID) | OROMUCOSAL | 0 refills | Status: DC
  Filled 2023-03-27: qty 300, 10d supply, fill #0

## 2023-03-27 MED ORDER — ESTRADIOL 0.1 MG/GM VA CREA
TOPICAL_CREAM | VAGINAL | 12 refills | Status: AC
  Filled 2023-03-27: qty 42.5, 30d supply, fill #0

## 2023-03-27 NOTE — Assessment & Plan Note (Signed)
 Symptoms worsened with change from PPI to h2 blocker, pepcid 20 mg daily.  Has resumed PPI  daily

## 2023-03-27 NOTE — Assessment & Plan Note (Signed)
 Reviewed the results of recent swallow evaluation and reinforced lifestyle modifications

## 2023-03-27 NOTE — Assessment & Plan Note (Signed)
 All concerns about health issues were addressed today

## 2023-03-27 NOTE — Assessment & Plan Note (Signed)
 Rechecking level today.  Taking 61443 Ius daily

## 2023-03-27 NOTE — Assessment & Plan Note (Signed)
 Improving T score with use of Evista.  Discussed the risks and benefits of switching to Prolia (her request);  she has esophageal dysmotility and has to put meds in applesauce/oatmeal.  She has decide to continue Evista

## 2023-03-27 NOTE — Assessment & Plan Note (Signed)
 Epsidoes of pruritic reash may be due to venous stasis dermatitis.  Triamcinalone cream prescribed.

## 2023-03-27 NOTE — Progress Notes (Signed)
 Subjective:  Patient ID: [name] [name], female    DOB: January 09, 1956  Age: 68 y.o. MRN: [id number]  CC: The primary encounter diagnosis was Encounter for screening mammogram for malignant neoplasm of breast. Diagnoses of Gastroesophageal reflux disease with esophagitis, unspecified whether hemorrhage, Hyperlipidemia, unspecified hyperlipidemia type, Esophageal dysphagia, Vitamin D  deficiency, Iron deficiency anemia, unspecified iron deficiency anemia type, Age-related osteoporosis without current pathological fracture, Esophageal dysmotility, Anxiety about health, and Varicose veins of both lower extremities, unspecified whether complicated were also pertinent to this visit.   HPI [name] [name] presents for  Chief Complaint  Patient presents with   Medical Management of Chronic Issues    6 month follow up    1) osteoporosis:  she is taking evista   T scores improving steadily based on Sept 2024 DEXA but wondering if she can swtich to Prolia to avoid a daily pill.   2)  esophageal dysmotility:  reviewed prior swallow evaluation and lifestyle modifications recommended.  Taking pills in oatmeal.  Prefers to limit tabets/capsules   3) recurrent puritic rash on both ankles occurs after wearing tight socks in bed . Occurs during the winter  when she wears socks to bed   4)  hiatal hernia .  Has been coughing  more despite taking  taking PPI correctly ,  drinking decaffeinated beverages ,  and sleeping on a wedge . Denies shortness of breath, pleurisy,  fevers, body aches   5) Preservision AREDS 2  vitamins recommended  by ophthalmologist but not started yet.   6) Vitamin D  deficiency:  taking 1250 IUs daily     Outpatient Medications Prior to Visit  Medication Sig Dispense Refill   calcium citrate-vitamin D  (CELEBRATE CALCIUM CITRATE) 500-500 MG-UNIT chewable tablet Chew 1 tablet by mouth daily.      cholecalciferol (VITAMIN D3) 25 MCG (1000 UNIT) tablet Take 1,000 Units by mouth daily.      Cyanocobalamin  (VITAMIN B-12) 3000 MCG SUBL Place 3,000 mcg under the tongue daily.     raloxifene  (EVISTA ) 60 MG tablet Take 1 tablet (60 mg total) by mouth daily. 90 tablet 3   estradiol  (ESTRACE ) 0.1 MG/GM vaginal cream Discard applicator Apply pea sized amount to tip of finger to urethra before bed. Wash hands well after application. Use Monday, Wednesday and Friday 42.5 g 12   omeprazole  (PRILOSEC) 40 MG capsule Take 1 capsule (40 mg total) by mouth daily. 90 capsule 0   Zoster Vaccine Adjuvanted (SHINGRIX ) injection Inject into the muscle once 0.5 mL 1   No facility-administered medications prior to visit.    Review of Systems;  Patient denies headache, fevers, malaise, unintentional weight loss, skin rash, eye pain, sinus congestion and sinus pain, sore throat, dysphagia,  hemoptysis , cough, dyspnea, wheezing, chest pain, palpitations, orthopnea, edema, abdominal pain, nausea, melena, diarrhea, constipation, flank pain, dysuria, hematuria, urinary  Frequency, nocturia, numbness, tingling, seizures,  Focal weakness, Loss of consciousness,  Tremor, insomnia, depression, anxiety, and suicidal ideation.      Objective:  BP 122/60   Pulse 84   Ht 5' 4 (1.626 m)   Wt 135 lb 3.2 oz (61.3 kg)   LMP 11/20/2012   SpO2 98%   BMI 23.21 kg/m   BP Readings from Last 3 Encounters:  03/27/23 122/60  09/24/22 116/60  03/23/22 120/62    Wt Readings from Last 3 Encounters:  03/27/23 135 lb 3.2 oz (61.3 kg)  09/24/22 134 lb 3.2 oz (60.9 kg)  03/23/22 132 lb 6.4 oz (60.1  kg)    Physical Exam Vitals reviewed.  Constitutional:      General: She is not in acute distress.    Appearance: Normal appearance. She is normal weight. She is not ill-appearing, toxic-appearing or diaphoretic.  HENT:     Head: Normocephalic.  Eyes:     General: No scleral icterus.       Right eye: No discharge.        Left eye: No discharge.     Conjunctiva/sclera: Conjunctivae normal.  Cardiovascular:      Rate and Rhythm: Normal rate and regular rhythm.     Heart sounds: Normal heart sounds.  Pulmonary:     Effort: Pulmonary effort is normal. No respiratory distress.     Breath sounds: Normal breath sounds.  Musculoskeletal:        General: Normal range of motion.  Skin:    General: Skin is warm and dry.  Neurological:     General: No focal deficit present.     Mental Status: She is alert and oriented to person, place, and time. Mental status is at baseline.  Psychiatric:        Mood and Affect: Mood normal.        Behavior: Behavior normal.        Thought Content: Thought content normal.        Judgment: Judgment normal.    Lab Results  Component Value Date   HGBA1C 5.3 09/19/2022   HGBA1C 5.5 09/14/2021    Lab Results  Component Value Date   CREATININE 0.54 03/27/2023   CREATININE 0.61 09/19/2022   CREATININE 0.55 03/23/2022    Lab Results  Component Value Date   WBC 4.2 03/27/2023   HGB 11.9 (L) 03/27/2023   HCT 36.7 03/27/2023   PLT 220.0 03/27/2023   GLUCOSE 86 03/27/2023   CHOL 166 03/27/2023   TRIG 73.0 03/27/2023   HDL 67.00 03/27/2023   LDLDIRECT 76.0 03/27/2023   LDLCALC 84 03/27/2023   ALT 11 03/27/2023   AST 16 03/27/2023   NA 140 03/27/2023   K 4.1 03/27/2023   CL 102 03/27/2023   CREATININE 0.54 03/27/2023   BUN 21 03/27/2023   CO2 32 03/27/2023   TSH 1.84 09/19/2022   HGBA1C 5.3 09/19/2022    DG Bone Density Result Date: 11/27/2022 EXAM: DUAL X-RAY ABSORPTIOMETRY (DXA) FOR BONE MINERAL DENSITY IMPRESSION: Your patient [name] [name] completed a BMD test on 11/27/2022 using the [employer] DXA System (software version: 14.10) manufactured by [employer]. The following summarizes the results of our evaluation. Technologist: [facility] PATIENT BIOGRAPHICAL: Name: [name] [name] Patient ID: [id number] Birth Date: December 03, 1955 Height: 64.0 in. Gender: Female Exam Date: 11/27/2022 Weight: 133.9 lbs. Indications: Caucasian, Postmenopausal  Fractures: Treatments: Calcium, Evista , Omeprazole , Vitamin D  DENSITOMETRY RESULTS: Site         Region      Measured Date Measured Age WHO Classification Young Adult T-score BMD         %Change vs. Previous Significant Change (*) DualFemur Total Right 11/27/2023 65.7 Osteoporosis -2.5 0.687 g/cm2 3.8% Yes DualFemur Total Right 11/08/2020 63.6 Osteoporosis -2.7 0.662 g/cm2 3.0% Yes DualFemur Total Right 10/28/2018 61.6 Osteoporosis -2.9 0.643 g/cm2 -13.3% Yes DualFemur Total Right 09/23/2013 56.5 Osteopenia -2.1 0.742 g/cm2 - - DualFemur Total Mean 11/27/2022 65.7 Osteoporosis -2.5 0.690 g/cm2 3.0% Yes DualFemur Total Mean 11/08/2020 63.6 Osteoporosis -2.7 0.670 g/cm2 2.3% - DualFemur Total Mean 10/28/2018 61.6 Osteoporosis -2.8 0.655 g/cm2 -13.4% Yes DualFemur Total Mean 09/23/2013 56.5 Osteopenia -  2.0 0.756 g/cm2 - - Left Forearm Radius 33% 11/27/2022 65.7 Osteopenia -2.3 0.676 g/cm2 -2.0% - Left Forearm Radius 33% 11/08/2020 63.6 Osteopenia -2.1 0.690 g/cm2 -17.6% Yes Left Forearm Radius 33% 09/23/2013 56.5 Normal -0.4 0.837 g/cm2 - - ASSESSMENT: The BMD measured at Femur Total Right is 0.687 g/cm2 with a T-score of -2.5. This patient is considered osteoporotic according to World Health Organization [facility]) criteria. The scan quality is good. Lumbar spine was not utilized due to advanced degenerative changes. Compared with prior study, there has been significant increase in the total hip. [employer] [facility]) criteria for post-menopausal, Caucasian Women: Normal:                   T-score at or above -1 SD Osteopenia/low bone mass: T-score between -1 and -2.5 SD Osteoporosis:             T-score at or below -2.5 SD RECOMMENDATIONS: 1. All patients should optimize calcium and vitamin D  intake. 2. Consider FDA-approved medical therapies in postmenopausal women and men aged 43 years and older, based on the following: a. A hip or vertebral(clinical or morphometric) fracture b. T-score < -2.5 at the femoral  neck or spine after appropriate evaluation to exclude secondary causes c. Low bone mass (T-score between -1.0 and -2.5 at the femoral neck or spine) and a 10-year probability of a hip fracture > 3% or a 10-year probability of a major osteoporosis-related fracture > 20% based on the [place] -adapted WHO algorithm 3. Clinician judgment and/or patient preferences may indicate treatment for people with 10-year fracture probabilities above or below these levels FOLLOW-UP: People with diagnosed cases of osteoporosis or at high risk for fracture should have regular bone mineral density tests. For patients eligible for Medicare, routine testing is allowed once every 2 years. The testing frequency can be increased to one year for patients who have rapidly progressing disease, those who are receiving or discontinuing medical therapy to restore bone mass, or have additional risk factors. I have reviewed this report, and agree with the above findings. [facility] Electronically Signed   By: Rosaline Collet M.D.   On: 11/27/2022 09:01    Assessment & Plan:  .Encounter for screening mammogram for malignant neoplasm of breast -     3D Screening Mammogram, Left and Right; Future  Gastroesophageal reflux disease with esophagitis, unspecified whether hemorrhage Assessment & Plan: Symptoms worsened with change from PPI to h2 blocker, pepcid 20 mg daily.  Has resumed PPI  daily   Orders: -     Omeprazole ; Take 1 capsule (40 mg total) by mouth daily.  Dispense: 90 capsule; Refill: 0  Hyperlipidemia, unspecified hyperlipidemia type -     Lipid panel -     LDL cholesterol, direct -     Comprehensive metabolic panel  Esophageal dysphagia Assessment & Plan: We again reviewed results of Barium swallow:  no streictures ,  but  s/s of esophagal motility noted.    CT neck and EGD :  normal.  Manometery offered by GI but deferred    Vitamin D  deficiency Assessment & Plan: Rechecking level today.  Taking 12000  Ius daily   Orders: -     VITAMIN D  25 Hydroxy (Vit-D Deficiency, Fractures)  Iron deficiency anemia, unspecified iron deficiency anemia type -     IBC + Ferritin -     CBC with Differential/Platelet  Age-related osteoporosis without current pathological fracture Assessment & Plan: Improving T score with use of Evista .  Discussed the risks and benefits of switching to Prolia (her request);  she has esophageal dysmotility and has to put meds in applesauce/oatmeal.  She has decide to continue Evista     Esophageal dysmotility Assessment & Plan: Reviewed the results of recent swallow evaluation and reinforced lifestyle modifications    Anxiety about health Assessment & Plan: All concerns about health issues were addressed today    Varicose veins of both lower extremities, unspecified whether complicated Assessment & Plan: Epsidoes of pruritic reash may be due to venous stasis dermatitis.  Triamcinalone cream prescribed.   Other orders -     Estradiol ; Discard applicator Apply pea sized amount to tip of finger to urethra before bed. Wash hands well after application. Use Monday, Wednesday and Friday  Dispense: 42.5 g; Refill: 12 -     magic mouthwash (nystatin , hydrocortisone , diphenhydrAMINE ) suspension; Swish and spit 10 mLs 3 (three) times daily.  Dispense: 300 mL; Refill: 0 -     Triamcinolone  Acetonide; Apply 1 Application topically to rash twice daily.  Dispense: 30 g; Refill: 0     I provided 30 minutes of face-to-face time during this encounter reviewing patient's last visit with me, patient's  most recent visit with gastroenterology,  previous surgical and non surgical procedures, previous  labs and imaging studies, counseling on currently addressed issues,  and post visit ordering to diagnostics and therapeutics .   Follow-up: Return in about 6 months (around 09/24/2023) for physical.   Verneita LITTIE Kettering, MD

## 2023-03-27 NOTE — Assessment & Plan Note (Signed)
 We again reviewed results of Barium swallow:  no streictures ,  but  s/s of esophagal motility noted.    CT neck and EGD :  normal.  Manometery offered by GI but deferred

## 2023-03-29 DIAGNOSIS — E611 Iron deficiency: Secondary | ICD-10-CM | POA: Insufficient documentation

## 2023-03-29 MED ORDER — FERROUS SULFATE 300 (60 FE) MG/5ML PO SOLN
300.0000 mg | Freq: Every day | ORAL | 3 refills | Status: AC
  Filled 2023-03-29: qty 120, 24d supply, fill #0

## 2023-03-29 NOTE — Addendum Note (Signed)
 Addended by: Sherlene Shams on: 03/29/2023 06:30 PM   Modules accepted: Orders

## 2023-03-29 NOTE — Assessment & Plan Note (Signed)
 Iron supplementation advised  Lab Results  Component Value Date   IRON 70 03/27/2023   TIBC 455.0 (H) 03/27/2023   FERRITIN 7.2 (L) 03/27/2023

## 2023-03-31 ENCOUNTER — Other Ambulatory Visit: Payer: Self-pay

## 2023-04-01 ENCOUNTER — Other Ambulatory Visit: Payer: Self-pay

## 2023-04-02 ENCOUNTER — Other Ambulatory Visit: Payer: Self-pay

## 2023-04-03 ENCOUNTER — Other Ambulatory Visit: Payer: Self-pay

## 2023-04-04 ENCOUNTER — Other Ambulatory Visit: Payer: Self-pay

## 2023-04-05 ENCOUNTER — Other Ambulatory Visit: Payer: Self-pay

## 2023-04-08 ENCOUNTER — Other Ambulatory Visit: Payer: Self-pay

## 2023-04-09 ENCOUNTER — Other Ambulatory Visit: Payer: Self-pay

## 2023-04-10 ENCOUNTER — Other Ambulatory Visit: Payer: Self-pay

## 2023-04-11 ENCOUNTER — Other Ambulatory Visit: Payer: Self-pay

## 2023-04-12 ENCOUNTER — Other Ambulatory Visit: Payer: Self-pay

## 2023-04-15 ENCOUNTER — Other Ambulatory Visit: Payer: Self-pay

## 2023-04-18 ENCOUNTER — Other Ambulatory Visit: Payer: Self-pay

## 2023-04-25 ENCOUNTER — Other Ambulatory Visit: Payer: Self-pay

## 2023-04-29 ENCOUNTER — Other Ambulatory Visit: Payer: Self-pay

## 2023-04-30 ENCOUNTER — Other Ambulatory Visit: Payer: Self-pay

## 2023-05-02 ENCOUNTER — Other Ambulatory Visit: Payer: Self-pay

## 2023-05-08 ENCOUNTER — Other Ambulatory Visit: Payer: Self-pay

## 2023-05-09 ENCOUNTER — Other Ambulatory Visit: Payer: Self-pay

## 2023-05-16 ENCOUNTER — Other Ambulatory Visit: Payer: Self-pay

## 2023-05-20 ENCOUNTER — Telehealth: Payer: Self-pay | Admitting: Internal Medicine

## 2023-05-20 DIAGNOSIS — E611 Iron deficiency: Secondary | ICD-10-CM

## 2023-05-20 NOTE — Telephone Encounter (Signed)
 Patient need lab orders.

## 2023-05-23 ENCOUNTER — Other Ambulatory Visit: Payer: Self-pay

## 2023-05-24 ENCOUNTER — Other Ambulatory Visit: Payer: Self-pay

## 2023-05-27 ENCOUNTER — Other Ambulatory Visit (INDEPENDENT_AMBULATORY_CARE_PROVIDER_SITE_OTHER): Payer: Commercial Managed Care - PPO

## 2023-05-27 DIAGNOSIS — E611 Iron deficiency: Secondary | ICD-10-CM | POA: Diagnosis not present

## 2023-05-28 LAB — CBC WITH DIFFERENTIAL/PLATELET
Basophils Absolute: 0.1 10*3/uL (ref 0.0–0.1)
Basophils Relative: 1.5 % (ref 0.0–3.0)
Eosinophils Absolute: 0.1 10*3/uL (ref 0.0–0.7)
Eosinophils Relative: 1.7 % (ref 0.0–5.0)
HCT: 37.3 % (ref 36.0–46.0)
Hemoglobin: 12.2 g/dL (ref 12.0–15.0)
Lymphocytes Relative: 21.2 % (ref 12.0–46.0)
Lymphs Abs: 1.6 10*3/uL (ref 0.7–4.0)
MCHC: 32.8 g/dL (ref 30.0–36.0)
MCV: 88.5 fl (ref 78.0–100.0)
Monocytes Absolute: 0.7 10*3/uL (ref 0.1–1.0)
Monocytes Relative: 9.2 % (ref 3.0–12.0)
Neutro Abs: 5 10*3/uL (ref 1.4–7.7)
Neutrophils Relative %: 66.4 % (ref 43.0–77.0)
Platelets: 211 10*3/uL (ref 150.0–400.0)
RBC: 4.22 Mil/uL (ref 3.87–5.11)
RDW: 15.1 % (ref 11.5–15.5)
WBC: 7.6 10*3/uL (ref 4.0–10.5)

## 2023-05-28 LAB — IBC + FERRITIN
Ferritin: 9.5 ng/mL — ABNORMAL LOW (ref 10.0–291.0)
Iron: 51 ug/dL (ref 42–145)
Saturation Ratios: 13.2 % — ABNORMAL LOW (ref 20.0–50.0)
TIBC: 386.4 ug/dL (ref 250.0–450.0)
Transferrin: 276 mg/dL (ref 212.0–360.0)

## 2023-05-29 ENCOUNTER — Encounter: Payer: Self-pay | Admitting: Internal Medicine

## 2023-05-29 NOTE — Progress Notes (Signed)
  your anemia has resolved , But your iron stores are still slightly low. If you have not been taking iron,  you do not need it ,  but if you have been taking a supplement ,  continue at a reduced frequency of  every other day    Regards,    Duncan Dull, MD

## 2023-05-30 ENCOUNTER — Telehealth: Payer: Self-pay

## 2023-05-30 ENCOUNTER — Other Ambulatory Visit: Payer: Self-pay

## 2023-05-30 NOTE — Telephone Encounter (Signed)
 Spoke with pharmacy and they stated that they reached out to the pt because they had just received the liquid iron that was ordered back in January. I called pt back to explain and I let her know that per her last lab result she does not need to pick up the liquid iron. Pt gave a verbal understanding.

## 2023-05-30 NOTE — Telephone Encounter (Signed)
 Copied from CRM [id number]. Topic: Clinical - Medication Question >> May 30, 2023 10:52 AM [name] J wrote: Reason for CRM: Pt states she received a call from her pharmacy telling her that she had a new prescription and she want to know what the prescription Is for. I didn't see any new prescriptions in her chart and didn't know the name of the medication

## 2023-06-20 ENCOUNTER — Ambulatory Visit
Admission: RE | Admit: 2023-06-20 | Discharge: 2023-06-20 | Disposition: A | Discharge status: Home / Self Care | Source: Ambulatory Visit | Attending: Internal Medicine | Admitting: Internal Medicine

## 2023-06-20 DIAGNOSIS — Z1231 Encounter for screening mammogram for malignant neoplasm of breast: Secondary | ICD-10-CM | POA: Insufficient documentation

## 2023-08-08 ENCOUNTER — Other Ambulatory Visit: Payer: Self-pay

## 2023-08-14 ENCOUNTER — Other Ambulatory Visit: Payer: Self-pay

## 2023-08-14 ENCOUNTER — Other Ambulatory Visit: Payer: Self-pay | Admitting: Internal Medicine

## 2023-08-14 DIAGNOSIS — K2100 Gastro-esophageal reflux disease with esophagitis, without bleeding: Secondary | ICD-10-CM

## 2023-08-15 ENCOUNTER — Other Ambulatory Visit: Payer: Self-pay

## 2023-08-16 ENCOUNTER — Other Ambulatory Visit: Payer: Self-pay

## 2023-08-16 MED ORDER — NYSTATIN 100000 UNIT/ML MT SUSP
10.0000 mL | Freq: Three times a day (TID) | OROMUCOSAL | 1 refills | Status: AC
  Filled 2023-08-16: qty 300, 10d supply, fill #0
  Filled 2024-01-02: qty 300, 10d supply, fill #1

## 2023-08-16 MED ORDER — OMEPRAZOLE 40 MG PO CPDR
40.0000 mg | DELAYED_RELEASE_CAPSULE | Freq: Every day | ORAL | 1 refills | Status: DC
  Filled 2023-08-16: qty 90, 90d supply, fill #0

## 2023-08-16 NOTE — Telephone Encounter (Signed)
 Spoke with pt and she stated that Dr. Mcqueen started prescribing this for her because she would get places in her mouth. Pt stated that she just likes to keep this on hand in case those places come up again. Is it okay to refill?

## 2023-08-19 ENCOUNTER — Other Ambulatory Visit: Payer: Self-pay

## 2023-08-20 ENCOUNTER — Other Ambulatory Visit: Payer: Self-pay

## 2023-08-22 ENCOUNTER — Other Ambulatory Visit: Payer: Self-pay

## 2023-09-19 ENCOUNTER — Other Ambulatory Visit: Payer: Self-pay

## 2023-09-23 ENCOUNTER — Other Ambulatory Visit: Payer: Self-pay

## 2023-09-26 ENCOUNTER — Encounter: Payer: Commercial Managed Care - PPO | Admitting: Internal Medicine

## 2023-10-02 ENCOUNTER — Encounter: Payer: Commercial Managed Care - PPO | Admitting: Internal Medicine

## 2023-11-13 ENCOUNTER — Ambulatory Visit (INDEPENDENT_AMBULATORY_CARE_PROVIDER_SITE_OTHER): Admitting: Internal Medicine

## 2023-11-13 ENCOUNTER — Encounter: Payer: Self-pay | Admitting: Internal Medicine

## 2023-11-13 VITALS — BP 122/66 | HR 79 | Ht 64.0 in | Wt 134.8 lb

## 2023-11-13 DIAGNOSIS — R7301 Impaired fasting glucose: Secondary | ICD-10-CM | POA: Diagnosis not present

## 2023-11-13 DIAGNOSIS — Z0000 Encounter for general adult medical examination without abnormal findings: Secondary | ICD-10-CM | POA: Diagnosis not present

## 2023-11-13 DIAGNOSIS — R4589 Other symptoms and signs involving emotional state: Secondary | ICD-10-CM | POA: Diagnosis not present

## 2023-11-13 DIAGNOSIS — E611 Iron deficiency: Secondary | ICD-10-CM | POA: Diagnosis not present

## 2023-11-13 DIAGNOSIS — E785 Hyperlipidemia, unspecified: Secondary | ICD-10-CM | POA: Diagnosis not present

## 2023-11-13 DIAGNOSIS — M810 Age-related osteoporosis without current pathological fracture: Secondary | ICD-10-CM

## 2023-11-13 DIAGNOSIS — Z79899 Other long term (current) drug therapy: Secondary | ICD-10-CM | POA: Diagnosis not present

## 2023-11-13 LAB — COMPREHENSIVE METABOLIC PANEL WITH GFR
ALT: 17 U/L (ref 0–35)
AST: 21 U/L (ref 0–37)
Albumin: 3.7 g/dL (ref 3.5–5.2)
Alkaline Phosphatase: 53 U/L (ref 39–117)
BUN: 15 mg/dL (ref 6–23)
CO2: 30 meq/L (ref 19–32)
Calcium: 8.7 mg/dL (ref 8.4–10.5)
Chloride: 102 meq/L (ref 96–112)
Creatinine, Ser: 0.53 mg/dL (ref 0.40–1.20)
GFR: 96.19 mL/min (ref >60.00)
Glucose, Bld: 93 mg/dL (ref 70–99)
Potassium: 4.7 meq/L (ref 3.5–5.1)
Sodium: 140 meq/L (ref 135–145)
Total Bilirubin: 0.3 mg/dL (ref 0.2–1.2)
Total Protein: 7.1 g/dL (ref 6.0–8.3)

## 2023-11-13 LAB — LIPID PANEL
Cholesterol: 152 mg/dL (ref 0–200)
HDL: 66.9 mg/dL (ref >39.00)
LDL Cholesterol: 70 mg/dL (ref 0–99)
NonHDL: 85.36
Total CHOL/HDL Ratio: 2
Triglycerides: 79 mg/dL (ref 0.0–149.0)
VLDL: 15.8 mg/dL (ref 0.0–40.0)

## 2023-11-13 LAB — CBC WITH DIFFERENTIAL/PLATELET
Basophils Absolute: 0 K/uL (ref 0.0–0.1)
Basophils Relative: 0.4 % (ref 0.0–3.0)
Eosinophils Absolute: 0.1 K/uL (ref 0.0–0.7)
Eosinophils Relative: 1.4 % (ref 0.0–5.0)
HCT: 39.4 % (ref 36.0–46.0)
Hemoglobin: 13 g/dL (ref 12.0–15.0)
Lymphocytes Relative: 25.6 % (ref 12.0–46.0)
Lymphs Abs: 1.2 K/uL (ref 0.7–4.0)
MCHC: 32.9 g/dL (ref 30.0–36.0)
MCV: 89.2 fl (ref 78.0–100.0)
Monocytes Absolute: 0.5 K/uL (ref 0.1–1.0)
Monocytes Relative: 9.9 % (ref 3.0–12.0)
Neutro Abs: 2.9 K/uL (ref 1.4–7.7)
Neutrophils Relative %: 62.7 % (ref 43.0–77.0)
Platelets: 205 K/uL (ref 150.0–400.0)
RBC: 4.42 Mil/uL (ref 3.87–5.11)
RDW: 13.5 % (ref 11.5–15.5)
WBC: 4.6 K/uL (ref 4.0–10.5)

## 2023-11-13 LAB — TSH: TSH: 2.07 u[IU]/mL (ref 0.35–5.50)

## 2023-11-13 LAB — IBC + FERRITIN
Ferritin: 16.9 ng/mL (ref 10.0–291.0)
Iron: 75 ug/dL (ref 42–145)
Saturation Ratios: 18.7 % — ABNORMAL LOW (ref 20.0–50.0)
TIBC: 401.8 ug/dL (ref 250.0–450.0)
Transferrin: 287 mg/dL (ref 212.0–360.0)

## 2023-11-13 LAB — LDL CHOLESTEROL, DIRECT: Direct LDL: 67 mg/dL

## 2023-11-13 LAB — HEMOGLOBIN A1C: Hgb A1c MFr Bld: 5.6 % (ref 4.6–6.5)

## 2023-11-13 NOTE — Progress Notes (Unsigned)
 Patient ID: [name] [name], female    DOB: 18-Mar-1956  Age: 68 y.o. MRN: [id number]  The patient is here for annual preventive examination and management of other chronic and acute problems.   The risk factors are reflected in the social history.   The roster of all physicians providing medical care to patient - is listed in the Snapshot section of the chart.   Activities of daily living:  The patient is 100% independent in all ADLs: dressing, toileting, feeding as well as independent mobility   Home safety : The patient has smoke detectors in the home. They wear seatbelts.  There are no unsecured firearms at home. There is no violence in the home.    There is no risks for hepatitis, STDs or HIV. There is no   history of blood transfusion. They have no travel history to infectious disease endemic areas of the world.   The patient has seen their dentist in the last six month. They have seen their eye doctor in the last year. The patinet  denies slight hearing difficulty with regard to whispered voices and some television programs.  They have deferred audiologic testing in the last year.  They do not  have excessive sun exposure. Discussed the need for sun protection: hats, long sleeves and use of sunscreen if there is significant sun exposure.    Diet: the importance of a healthy diet is discussed. They do have a healthy diet.   The benefits of regular aerobic exercise were discussed. The patient  exercises  5 days per week  for  60 minutes.    Depression screen: there are no signs or vegative symptoms of depression- irritability, change in appetite, anhedonia, sadness/tearfullness.   The following portions of the patient's history were reviewed and updated as appropriate: allergies, current medications, past family history, past medical history,  past surgical history, past social history  and problem list.   Visual acuity was not assessed per patient preference since the patient has regular  follow up with an  ophthalmologist. Hearing and body mass index were assessed and reviewed.    During the course of the visit the patient was educated and counseled about appropriate screening and preventive services including : fall prevention , diabetes screening, nutrition counseling, colorectal cancer screening, and recommended immunizations.    Chief Complaint:  WORRIED ABOUT EXCESSIVE VITAMIN E AND WEIGHT GAIN FROM TAKING PRESERVISION  IRON SUPPLEMENT : USING IRON GUMMIES THAT CONTAIN VITAMIN C ,  AVERAGES 3-4 TIMES PER WEK   Review of Symptoms  Patient denies headache, fevers, malaise, unintentional weight loss, skin rash, eye pain, sinus congestion and sinus pain, sore throat,  hemoptysis , cough, dyspnea, wheezing, chest pain, palpitations, orthopnea, edema, abdominal pain, nausea, melena, diarrhea, constipation, flank pain, dysuria, hematuria, urinary  Frequency, nocturia, numbness, tingling, seizures,  Focal weakness, Loss of consciousness,  Tremor, insomnia, depression, anxiety, and suicidal ideation.    Physical Exam:  BP 122/66   Pulse 79   Ht 5' 4 (1.626 m)   Wt 134 lb 12.8 oz (61.1 kg)   LMP 11/20/2012   SpO2 98%   BMI 23.14 kg/m    Physical Exam Vitals reviewed.  Constitutional:      General: She is not in acute distress.    Appearance: Normal appearance. She is well-developed and normal weight. She is not ill-appearing, toxic-appearing or diaphoretic.  HENT:     Head: Normocephalic.     Right Ear: Tympanic membrane, ear canal and external ear  normal. There is no impacted cerumen.     Left Ear: Tympanic membrane, ear canal and external ear normal. There is no impacted cerumen.     Nose: Nose normal.     Mouth/Throat:     Mouth: Mucous membranes are moist.     Pharynx: Oropharynx is clear.  Eyes:     General: No scleral icterus.       Right eye: No discharge.        Left eye: No discharge.     Conjunctiva/sclera: Conjunctivae normal.     Pupils: Pupils are  equal, round, and reactive to light.  Neck:     Thyroid : No thyromegaly.     Vascular: No carotid bruit or JVD.  Cardiovascular:     Rate and Rhythm: Normal rate and regular rhythm.     Heart sounds: Normal heart sounds.  Pulmonary:     Effort: Pulmonary effort is normal. No respiratory distress.     Breath sounds: Normal breath sounds.  Chest:  Breasts:    Breasts are symmetrical.     Right: Normal. No swelling, inverted nipple, mass, nipple discharge, skin change or tenderness.     Left: Normal. No swelling, inverted nipple, mass, nipple discharge, skin change or tenderness.  Abdominal:     General: Bowel sounds are normal.     Palpations: Abdomen is soft. There is no mass.     Tenderness: There is no abdominal tenderness. There is no guarding or rebound.  Musculoskeletal:        General: Normal range of motion.     Cervical back: Normal range of motion and neck supple.  Lymphadenopathy:     Cervical: No cervical adenopathy.     Upper Body:     Right upper body: No supraclavicular, axillary or pectoral adenopathy.     Left upper body: No supraclavicular, axillary or pectoral adenopathy.  Skin:    General: Skin is warm and dry.  Neurological:     General: No focal deficit present.     Mental Status: She is alert and oriented to person, place, and time. Mental status is at baseline.  Psychiatric:        Mood and Affect: Mood normal.        Behavior: Behavior normal.        Thought Content: Thought content normal.        Judgment: Judgment normal.     Assessment and Plan: Iron deficiency Assessment & Plan: Improved but not resolved.   Orders: -     CBC with Differential/Platelet -     IBC + Ferritin  Hyperlipidemia, unspecified hyperlipidemia type -     Lipid panel -     LDL cholesterol, direct  Long-term use of high-risk medication -     CBC with Differential/Platelet -     TSH  Impaired fasting glucose -     Comprehensive metabolic panel with GFR -      Hemoglobin A1c  Age-related osteoporosis without current pathological fracture Assessment & Plan: Improving hip BMD (but not forearm BMD ) with use of Evista  basd on 2024 DEXA results.  Encouraged to add weight lifting to strengthen forearms. She continues to treat with Evista     Encounter for preventive health examination Assessment & Plan: age appropriate education and counseling updated, referrals for preventative services and immunizations addressed, dietary and smoking counseling addressed, most recent labs reviewed.  I have personally reviewed and have noted:   1) the patient's medical and social  history 2) The pt's use of alcohol, tobacco, and illicit drugs 3) The patient's current medications and supplements 4) Functional ability including ADL's, fall risk, home safety risk, hearing and visual impairment 5) Diet and physical activities 6) Evidence for depression or mood disorder 7) The patient's height, weight, and BMI have been recorded in the chart  I have made referrals, and provided counseling and education based on review of the above    Anxiety about health Assessment & Plan: All concerns about health issues have improved      Return in about 1 year (around 11/12/2024) for physical.  Verneita LITTIE Kettering, MD

## 2023-11-13 NOTE — Patient Instructions (Addendum)
 YOU HAVE NOT GAINED WEIGHT.  YOU'RE JUST EXPERIENCING THE REDISTRIBUTION OF FAT DUE TO MENOPAUSE.    GET BACK TO EXERCISING !  DON'T FORGET TO DO CORE EXERCISES (ABS)   IF YOU WANT ME TO CHECK YOUR VITAMIN E , RESUME THE PRESERVISION AND SCHEDULE A LAB APPT AFTER 4 WEEKS O F TAKING IT   SIMETHICONE IS THE ACTIVE INGREDIENT FOR GAS  .  TRY MYLANTA GAS   MINI SWEET PEPPERS  ARE A GREAT SOURCE OF VITAMIN C

## 2023-11-14 ENCOUNTER — Other Ambulatory Visit: Payer: Self-pay

## 2023-11-14 ENCOUNTER — Encounter: Payer: Self-pay | Admitting: Internal Medicine

## 2023-11-14 DIAGNOSIS — K2100 Gastro-esophageal reflux disease with esophagitis, without bleeding: Secondary | ICD-10-CM

## 2023-11-14 MED ORDER — RALOXIFENE HCL 60 MG PO TABS
60.0000 mg | ORAL_TABLET | Freq: Every day | ORAL | 1 refills | Status: AC
  Filled 2023-11-14: qty 90, 90d supply, fill #0
  Filled 2024-03-11: qty 90, 90d supply, fill #1

## 2023-11-14 MED ORDER — OMEPRAZOLE 40 MG PO CPDR
40.0000 mg | DELAYED_RELEASE_CAPSULE | Freq: Every day | ORAL | 1 refills | Status: AC
  Filled 2023-11-14: qty 90, 90d supply, fill #0
  Filled 2024-02-18: qty 90, 90d supply, fill #1

## 2023-11-14 NOTE — Telephone Encounter (Signed)
 Omeprazole  has been refilled. The Raloxifene  has a historical provider on it. Is it okay to refill the Raloxifene ?  Last OV: 11/13/2023 Next OV: 11/13/2024

## 2023-11-15 ENCOUNTER — Ambulatory Visit: Payer: Self-pay | Admitting: Internal Medicine

## 2023-11-15 NOTE — Assessment & Plan Note (Addendum)
 Improving hip BMD (but not forearm BMD ) with use of Evista  basd on 2024 DEXA results.  Encouraged to add weight lifting to strengthen forearms. She continues to treat with Evista 

## 2023-11-15 NOTE — Assessment & Plan Note (Signed)

## 2023-11-15 NOTE — Assessment & Plan Note (Signed)
 Improved but not resolved.

## 2023-11-15 NOTE — Assessment & Plan Note (Signed)
 All concerns about health issues have improved

## 2024-01-02 ENCOUNTER — Other Ambulatory Visit: Payer: Self-pay

## 2024-01-21 NOTE — Progress Notes (Unsigned)
   01/24/2024 9:12 AM   [name] [name] Sharps 1955/11/16 [id number]  Reason for visit: Follow up urethral caruncle   HPI: 68 y.o. female, initial follow up with me today, previously seen by Dr. Penne in 2022 First time meeting today, very pleasant patient She has been on topical Estrace  for 3 years, 2-3 times a week for urethral caruncle, vaginal irritation Within the last year-Estrace  has seemed to cause more vaginal irritation with application  - She did pause Estrace  for a period, and symptoms improved She mentions that urethral caruncle is still visible No UTI symptoms  Prior HPI: Seen by Dr. Penne in 2022  - dx with urethral caruncle  - prescribed topical Estrogen x3 weekly    Physical Exam: LMP 11/20/2012    Constitutional:  Alert and oriented, No acute distress. GU: Deferred  Laboratory Data: N/A  Pertinent Imaging: N/A    Assessment & Plan:    Urethral caruncle Assessment & Plan: Dx in 2022, prescribed topical Estrace  Unresolved urethral caruncle, intermittent symptoms Possible reaction to Estrace   First, I think we may consider discontinuing Estrace -as her main symptoms seem to be vaginal mucosal irritation, which is worsened with Estrace  application last year.  I would recommend a water-based lubricant or Vaseline only-and see if this helps.  I offered a pelvic exam today, although she preferred to wait.   - If she does not reach treatment goals, she may follow-up with us .  Recommend connecting with one of my partners who specializes in female pelvic anatomy.         Penne JONELLE Skye, MD  [facility] [address] [place], [place] [address] [phone]

## 2024-01-21 NOTE — Assessment & Plan Note (Signed)
 Dx in 2022, prescribed topical Estrace  Unresolved urethral caruncle, intermittent symptoms Possible reaction to Estrace   First, I think we may consider discontinuing Estrace -as her main symptoms seem to be vaginal mucosal irritation, which is worsened with Estrace  application last year.  I would recommend a water-based lubricant or Vaseline only-and see if this helps.  I offered a pelvic exam today, although she preferred to wait.   - If she does not reach treatment goals, she may follow-up with us .  Recommend connecting with one of my partners who specializes in female pelvic anatomy.

## 2024-01-24 ENCOUNTER — Ambulatory Visit: Admitting: Urology

## 2024-01-24 ENCOUNTER — Encounter: Payer: Self-pay | Admitting: Urology

## 2024-01-24 VITALS — BP 127/75 | HR 102 | Ht 64.0 in | Wt 137.8 lb

## 2024-01-24 DIAGNOSIS — N362 Urethral caruncle: Secondary | ICD-10-CM

## 2024-01-24 NOTE — Patient Instructions (Signed)
#  [phone] for [facility].

## 2024-02-06 DIAGNOSIS — D2272 Melanocytic nevi of left lower limb, including hip: Secondary | ICD-10-CM | POA: Diagnosis not present

## 2024-02-06 DIAGNOSIS — D485 Neoplasm of uncertain behavior of skin: Secondary | ICD-10-CM | POA: Diagnosis not present

## 2024-02-06 DIAGNOSIS — D225 Melanocytic nevi of trunk: Secondary | ICD-10-CM | POA: Diagnosis not present

## 2024-02-06 DIAGNOSIS — I788 Other diseases of capillaries: Secondary | ICD-10-CM | POA: Diagnosis not present

## 2024-02-06 DIAGNOSIS — D2271 Melanocytic nevi of right lower limb, including hip: Secondary | ICD-10-CM | POA: Diagnosis not present

## 2024-02-06 DIAGNOSIS — D2261 Melanocytic nevi of right upper limb, including shoulder: Secondary | ICD-10-CM | POA: Diagnosis not present

## 2024-02-06 DIAGNOSIS — L821 Other seborrheic keratosis: Secondary | ICD-10-CM | POA: Diagnosis not present

## 2024-02-06 DIAGNOSIS — D2262 Melanocytic nevi of left upper limb, including shoulder: Secondary | ICD-10-CM | POA: Diagnosis not present

## 2024-02-06 DIAGNOSIS — L820 Inflamed seborrheic keratosis: Secondary | ICD-10-CM | POA: Diagnosis not present

## 2024-02-18 ENCOUNTER — Other Ambulatory Visit: Payer: Self-pay

## 2024-03-03 ENCOUNTER — Encounter: Payer: Self-pay | Admitting: Internal Medicine

## 2024-03-03 DIAGNOSIS — N362 Urethral caruncle: Secondary | ICD-10-CM

## 2024-03-04 ENCOUNTER — Other Ambulatory Visit: Payer: Self-pay | Admitting: Internal Medicine

## 2024-03-04 DIAGNOSIS — N362 Urethral caruncle: Secondary | ICD-10-CM

## 2024-03-06 ENCOUNTER — Other Ambulatory Visit: Payer: Self-pay

## 2024-03-11 ENCOUNTER — Other Ambulatory Visit: Payer: Self-pay

## 2024-03-27 ENCOUNTER — Other Ambulatory Visit: Payer: Self-pay

## 2024-03-27 ENCOUNTER — Other Ambulatory Visit (HOSPITAL_COMMUNITY): Payer: Self-pay

## 2024-03-27 ENCOUNTER — Other Ambulatory Visit (HOSPITAL_BASED_OUTPATIENT_CLINIC_OR_DEPARTMENT_OTHER): Payer: Self-pay

## 2024-04-03 NOTE — Telephone Encounter (Signed)
 Noted.

## 2024-04-08 ENCOUNTER — Other Ambulatory Visit (HOSPITAL_COMMUNITY): Payer: Self-pay

## 2024-04-08 ENCOUNTER — Other Ambulatory Visit: Payer: Self-pay

## 2024-04-09 ENCOUNTER — Other Ambulatory Visit (HOSPITAL_COMMUNITY): Payer: Self-pay

## 2024-04-13 ENCOUNTER — Ambulatory Visit: Admitting: Urology

## 2024-06-29 ENCOUNTER — Ambulatory Visit: Admitting: Urology

## 2024-11-13 ENCOUNTER — Ambulatory Visit: Admitting: Internal Medicine
